# Patient Record
Sex: Male | Born: 1990 | Race: White | Hispanic: No | Smoking: Former smoker
Health system: Southern US, Community
[De-identification: ages and names within clinical notes are randomized; demographics above are authoritative.]

## PROBLEM LIST (undated history)

## (undated) DIAGNOSIS — F1010 Alcohol abuse, uncomplicated: Secondary | ICD-10-CM

## (undated) DIAGNOSIS — A048 Other specified bacterial intestinal infections: Secondary | ICD-10-CM

## (undated) DIAGNOSIS — I10 Essential (primary) hypertension: Secondary | ICD-10-CM

## (undated) DIAGNOSIS — M8700 Idiopathic aseptic necrosis of unspecified bone: Secondary | ICD-10-CM

## (undated) DIAGNOSIS — J45909 Unspecified asthma, uncomplicated: Secondary | ICD-10-CM

## (undated) HISTORY — DX: Unspecified asthma, uncomplicated: J45.909

## (undated) HISTORY — DX: Idiopathic aseptic necrosis of unspecified bone: M87.00

## (undated) HISTORY — PX: TOTAL HIP ARTHROPLASTY: SHX124

## (undated) HISTORY — DX: Other specified bacterial intestinal infections: A04.8

## (undated) HISTORY — PX: NO PAST SURGERIES: SHX2092

## (undated) HISTORY — DX: Alcohol abuse, uncomplicated: F10.10

---

## 2014-12-31 ENCOUNTER — Telehealth (HOSPITAL_COMMUNITY): Payer: Self-pay | Admitting: Family Medicine

## 2014-12-31 ENCOUNTER — Emergency Department (INDEPENDENT_AMBULATORY_CARE_PROVIDER_SITE_OTHER)
Admission: EM | Admit: 2014-12-31 | Discharge: 2014-12-31 | Disposition: A | Discharge status: Home / Self Care | Payer: PRIVATE HEALTH INSURANCE | Source: Home / Self Care | Attending: Family Medicine | Admitting: Family Medicine

## 2014-12-31 ENCOUNTER — Encounter (HOSPITAL_COMMUNITY): Payer: Self-pay | Admitting: Emergency Medicine

## 2014-12-31 DIAGNOSIS — R109 Unspecified abdominal pain: Secondary | ICD-10-CM | POA: Diagnosis not present

## 2014-12-31 DIAGNOSIS — R42 Dizziness and giddiness: Secondary | ICD-10-CM | POA: Diagnosis not present

## 2014-12-31 LAB — CK: Total CK: 95 U/L (ref 49–397)

## 2014-12-31 LAB — CBC
HCT: 43.6 % (ref 39.0–52.0)
HEMOGLOBIN: 15.6 g/dL (ref 13.0–17.0)
MCH: 30.8 pg (ref 26.0–34.0)
MCHC: 35.8 g/dL (ref 30.0–36.0)
MCV: 86 fL (ref 78.0–100.0)
Platelets: 176 10*3/uL (ref 150–400)
RBC: 5.07 MIL/uL (ref 4.22–5.81)
RDW: 12 % (ref 11.5–15.5)
WBC: 7 10*3/uL (ref 4.0–10.5)

## 2014-12-31 LAB — COMPREHENSIVE METABOLIC PANEL
ALBUMIN: 4.4 g/dL (ref 3.5–5.0)
ALT: 14 U/L — ABNORMAL LOW (ref 17–63)
ANION GAP: 8 (ref 5–15)
AST: 19 U/L (ref 15–41)
Alkaline Phosphatase: 55 U/L (ref 38–126)
BUN: 13 mg/dL (ref 6–20)
CALCIUM: 9.5 mg/dL (ref 8.9–10.3)
CHLORIDE: 104 mmol/L (ref 101–111)
CO2: 27 mmol/L (ref 22–32)
CREATININE: 1.3 mg/dL — AB (ref 0.61–1.24)
GFR calc non Af Amer: >60 mL/min (ref >60)
GLUCOSE: 96 mg/dL (ref 65–99)
Potassium: 3.9 mmol/L (ref 3.5–5.1)
SODIUM: 139 mmol/L (ref 135–145)
TOTAL PROTEIN: 7.3 g/dL (ref 6.5–8.1)
Total Bilirubin: 0.7 mg/dL (ref 0.3–1.2)

## 2014-12-31 LAB — LIPASE, BLOOD: LIPASE: 38 U/L (ref 22–51)

## 2014-12-31 NOTE — ED Notes (Signed)
C/o intermittent abd pain x1 month Sx will include cramping, bloating, nauseas, and feeling light headed Denies: fevers, chills, diarrhea  Alert, no signs of acute distress.

## 2014-12-31 NOTE — ED Notes (Signed)
Called patient discussed results. He will follow-up with the PCP and return to clinic in 2 days for lab recheck. He will drink plenty of fluids.  Rodolph Bong, MD 12/31/14 [phone]

## 2014-12-31 NOTE — ED Notes (Signed)
Call back number verified.  

## 2014-12-31 NOTE — Discharge Instructions (Signed)
Thank you for coming in today. If your belly pain worsens, or you have high fever, bad vomiting, blood in your stool or black tarry stool go to the Emergency Room.  Call or go to the emergency room if you get worse, have trouble breathing, have chest pains, or palpitations.  Follow up with a doctor.  Drink Plenty of fluids.  I will call you if any labs come back positive.   Orthostatic Hypotension Orthostatic hypotension is a sudden drop in blood pressure. It happens when you quickly stand up from a seated or lying position. You may feel dizzy or light-headed. This can last for just a few seconds or for up to a few minutes. It is usually not a serious problem. However, if this happens frequently or gets worse, it can be a sign of something more serious. CAUSES  Different things can cause orthostatic hypotension, including:   Loss of body fluids (dehydration).  Medicines that lower blood pressure.  Sudden changes in posture, such as standing up quickly after you have been sitting or lying down.  Taking too much of your medicine. SIGNS AND SYMPTOMS   Light-headedness or dizziness.   Fainting or near-fainting.   A fast heart rate.   Weakness.   Feeling tired (fatigue).  DIAGNOSIS  Your health care provider may do several things to help diagnose your condition and identify the cause. These may include:   Taking a medical history and doing a physical exam.  Checking your blood pressure. Your health care provider will check your blood pressure when you are:  Lying down.  Sitting.  Standing.  Using tilt table testing. In this test, you lie down on a table that moves from a lying position to a standing position. You will be strapped onto the table. This test monitors your blood pressure and heart rate when you are in different positions. TREATMENT  Treatment will vary depending on the cause. Possible treatments include:   Changing the dosage of your medicines.  Wearing  compression stockings on your lower legs.  Standing up slowly after sitting or lying down.  Eating more salt.  Eating frequent, small meals.  In some cases, getting IV fluids.  Taking medicine to enhance fluid retention. HOME CARE INSTRUCTIONS  Only take over-the-counter or prescription medicines as directed by your health care provider.  Follow your health care provider's instructions for changing the dosage of your current medicines.  Do not stop or adjust your medicine on your own.  Stand up slowly after sitting or lying down. This allows your body to adjust to the different position.  Wear compression stockings as directed.  Eat extra salt as directed.  Do not add extra salt to your diet unless directed to by your health care provider.  Eat frequent, small meals.  Avoid standing suddenly after eating.  Avoid hot showers or excessive heat as directed by your health care provider.  Keep all follow-up appointments. SEEK MEDICAL CARE IF:  You continue to feel dizzy or light-headed after standing.  You feel groggy or confused.  You feel cold, clammy, or sick to your stomach (nauseous).  You have blurred vision.  You feel short of breath. SEEK IMMEDIATE MEDICAL CARE IF:   You faint after standing.  You have chest pain.  You have difficulty breathing.   You lose feeling or movement in your arms or legs.   You have slurred speech or difficulty talking, or you are unable to talk.  MAKE SURE YOU:  Understand these instructions.  Will watch your condition.  Will get help right away if you are not doing well or get worse. Document Released: 06/26/2002 Document Revised: 07/11/2013 Document Reviewed: 04/28/2013 [facility] Patient Information 2015 [place], [place]. This information is not intended to replace advice given to you by your health care provider. Make sure you discuss any questions you have with your health care provider.    Abdominal Pain Many  things can cause belly (abdominal) pain. Most times, the belly pain is not dangerous. Many cases of belly pain can be watched and treated at home. HOME CARE   Do not take medicines that help you go poop (laxatives) unless told to by your doctor.  Only take medicine as told by your doctor.  Eat or drink as told by your doctor. Your doctor will tell you if you should be on a special diet. GET HELP IF:  You do not know what is causing your belly pain.  You have belly pain while you are sick to your stomach (nauseous) or have runny poop (diarrhea).  You have pain while you pee or poop.  Your belly pain wakes you up at night.  You have belly pain that gets worse or better when you eat.  You have belly pain that gets worse when you eat fatty foods.  You have a fever. GET HELP RIGHT AWAY IF:   The pain does not go away within 2 hours.  You keep throwing up (vomiting).  The pain changes and is only in the right or left part of the belly.  You have bloody or tarry looking poop. MAKE SURE YOU:   Understand these instructions.  Will watch your condition.  Will get help right away if you are not doing well or get worse. Document Released: 12/23/2007 Document Revised: 07/11/2013 Document Reviewed: 03/15/2013 [facility] Patient Information 2015 [place], [place]. This information is not intended to replace advice given to you by your health care provider. Make sure you discuss any questions you have with your health care provider.

## 2014-12-31 NOTE — ED Provider Notes (Signed)
Billy Johnson is a 24 y.o. male who presents to Urgent Care today for abdominal cramping. Patient has a one-month history of intermittent abdominal cramping associated with nausea feeling is that his heart is racing and lightheadedness. He denies any chest pains. Symptoms have been present for about a month. His symptoms are nonexertional. He has no significant diarrhea or constipation. No significant abdominal pain. He feels well otherwise.   History reviewed. No pertinent past medical history. History reviewed. No pertinent past surgical history. History  Substance Use Topics  . Smoking status: Current Every Day Smoker  . Smokeless tobacco: Not on file  . Alcohol Use: Yes   ROS as above Medications: No current facility-administered medications for this encounter.   No current outpatient prescriptions on file.   No Known Allergies   Exam:  BP 141/85 mmHg  Pulse 62  Temp(Src) 98.4 F (36.9 C) (Oral)  Resp 16  SpO2 99%  Orthostatic VS for the past 24 hrs:  BP- Lying Pulse- Lying BP- Sitting Pulse- Sitting BP- Standing at 0 minutes Pulse- Standing at 0 minutes  12/31/14 1723 123/78 mmHg 65 (!) 130/92 mmHg 70 122/85 mmHg 87      Gen: Well NAD HEENT: EOMI,  MMM Lungs: Normal work of breathing. CTABL Heart: RRR no MRG Abd: NABS, Soft. Nondistended, Nontender Exts: Brisk capillary refill, warm and well perfused.    ED ECG REPORT   Date: 12/31/2014  Rate: 71  Rhythm: normal sinus rhythm and sinus arrhythmia  QRS Axis: normal  Intervals: normal  ST/T Wave abnormalities: normal  Conduction Disutrbances:none  Narrative Interpretation:   Old EKG Reviewed: none available  I have personally reviewed the EKG tracing and agree with the computerized printout as noted.   Results for orders placed or performed during the hospital encounter of 12/31/14 (from the past 24 hour(s))  Comprehensive metabolic panel     Status: Abnormal   Collection Time: 12/31/14  5:16 PM   Result Value Ref Range   Sodium 139 135 - 145 mmol/L   Potassium 3.9 3.5 - 5.1 mmol/L   Chloride 104 101 - 111 mmol/L   CO2 27 22 - 32 mmol/L   Glucose, Bld 96 65 - 99 mg/dL   BUN 13 6 - 20 mg/dL   Creatinine, Ser 9.79 (H) 0.61 - 1.24 mg/dL   Calcium 9.5 8.9 - 89.2 mg/dL   Total Protein 7.3 6.5 - 8.1 g/dL   Albumin 4.4 3.5 - 5.0 g/dL   AST 19 15 - 41 U/L   ALT 14 (L) 17 - 63 U/L   Alkaline Phosphatase 55 38 - 126 U/L   Total Bilirubin 0.7 0.3 - 1.2 mg/dL   GFR calc non Af Amer >60 >60 mL/min   GFR calc Af Amer >60 >60 mL/min   Anion gap 8 5 - 15  Lipase, blood     Status: None   Collection Time: 12/31/14  5:16 PM  Result Value Ref Range   Lipase 38 22 - 51 U/L  CBC     Status: None   Collection Time: 12/31/14  5:16 PM  Result Value Ref Range   WBC 7.0 4.0 - 10.5 K/uL   RBC 5.07 4.22 - 5.81 MIL/uL   Hemoglobin 15.6 13.0 - 17.0 g/dL   HCT 11.9 41.7 - 40.8 %   MCV 86.0 78.0 - 100.0 fL   MCH 30.8 26.0 - 34.0 pg   MCHC 35.8 30.0 - 36.0 g/dL   RDW 14.4 81.8 - 56.3 %  Platelets 176 150 - 400 K/uL  CK     Status: None   Collection Time: 12/31/14  5:16 PM  Result Value Ref Range   Total CK 95 49 - 397 U/L   No results found.  Assessment and Plan: 24 y.o. male with mild orthostasis and abdominal cramping. Patient was sent home before labs were returned. CK was added on. I have called patient and discuss results. His main abnormal lab finding is an elevated creatinine at 1.3. His baseline is unknown. Plan to follow-up in 2 days for lab recheck and follow-up with PCP.  Discussed warning signs or symptoms. Please see discharge instructions. Patient expresses understanding.     Rodolph Bong, MD 12/31/14 [phone]

## 2015-04-22 ENCOUNTER — Emergency Department (HOSPITAL_BASED_OUTPATIENT_CLINIC_OR_DEPARTMENT_OTHER): Payer: No Typology Code available for payment source

## 2015-04-22 ENCOUNTER — Encounter (HOSPITAL_BASED_OUTPATIENT_CLINIC_OR_DEPARTMENT_OTHER): Payer: Self-pay | Admitting: *Deleted

## 2015-04-22 ENCOUNTER — Emergency Department (HOSPITAL_BASED_OUTPATIENT_CLINIC_OR_DEPARTMENT_OTHER)
Admission: EM | Admit: 2015-04-22 | Discharge: 2015-04-22 | Disposition: A | Discharge status: Home / Self Care | Payer: No Typology Code available for payment source | Attending: Emergency Medicine | Admitting: Emergency Medicine

## 2015-04-22 DIAGNOSIS — W228XXA Striking against or struck by other objects, initial encounter: Secondary | ICD-10-CM | POA: Insufficient documentation

## 2015-04-22 DIAGNOSIS — Y9289 Other specified places as the place of occurrence of the external cause: Secondary | ICD-10-CM | POA: Diagnosis not present

## 2015-04-22 DIAGNOSIS — Y9389 Activity, other specified: Secondary | ICD-10-CM | POA: Insufficient documentation

## 2015-04-22 DIAGNOSIS — M79672 Pain in left foot: Secondary | ICD-10-CM

## 2015-04-22 DIAGNOSIS — Z87891 Personal history of nicotine dependence: Secondary | ICD-10-CM | POA: Diagnosis not present

## 2015-04-22 DIAGNOSIS — S93602A Unspecified sprain of left foot, initial encounter: Secondary | ICD-10-CM | POA: Diagnosis not present

## 2015-04-22 DIAGNOSIS — S93402A Sprain of unspecified ligament of left ankle, initial encounter: Secondary | ICD-10-CM

## 2015-04-22 DIAGNOSIS — Y998 Other external cause status: Secondary | ICD-10-CM | POA: Diagnosis not present

## 2015-04-22 DIAGNOSIS — S99912A Unspecified injury of left ankle, initial encounter: Secondary | ICD-10-CM | POA: Diagnosis present

## 2015-04-22 MED ORDER — IBUPROFEN 800 MG PO TABS
800.0000 mg | ORAL_TABLET | Freq: Once | ORAL | Status: AC
  Administered 2015-04-22: 800 mg via ORAL
  Filled 2015-04-22: qty 1

## 2015-04-22 MED ORDER — HYDROCODONE-ACETAMINOPHEN 5-325 MG PO TABS: 1.0000 | ORAL_TABLET | Freq: Four times a day (QID) | ORAL | Status: DC | PRN

## 2015-04-22 MED ORDER — HYDROCODONE-ACETAMINOPHEN 5-325 MG PO TABS
2.0000 | ORAL_TABLET | Freq: Once | ORAL | Status: AC
  Administered 2015-04-22: 2 via ORAL
  Filled 2015-04-22: qty 2

## 2015-04-22 NOTE — ED Notes (Signed)
Dr. Molpus at [facility] 

## 2015-04-22 NOTE — ED Notes (Signed)
Pt to xray at this time. Pt alert, NAD, calm, interactive, resps e/u, no dyspnea noted, CMS intact, ROM decreased d/t pain, skin intact, swelling noted to ankle, no obvious bruising or deformity.

## 2015-04-22 NOTE — ED Provider Notes (Addendum)
CSN: [id number]     Arrival date & time May 05, 2015  0149 History   First MD Initiated Contact with Patient 2015/05/05 0319     Chief Complaint  Patient presents with  . Ankle Injury     (Consider location/radiation/quality/duration/timing/severity/associated sxs/prior Treatment) HPI  This is a 24 year old male who injured his left ankle while doing parcour yesterday afternoon about 4 PM. He slammed his left foot against the wall and either hyperdorsiflexed it or inverted it. He is having moderate to severe pain primarily over the base of the left fifth metatarsal. Pain is worse with ambulation, palpation or movement. There is associated swelling. He denies other injury.  History reviewed. No pertinent past medical history. History reviewed. No pertinent past surgical history. No family history on file. Social History  Substance Use Topics  . Smoking status: Former Games developer  . Smokeless tobacco: None  . Alcohol Use: Yes    Review of Systems  All other systems reviewed and are negative.   Allergies  Peanuts  Home Medications   Prior to Admission medications   Not on File   BP 136/92 mmHg  Pulse 107  Temp(Src) 98.7 F (37.1 C)  Resp 20  Ht  (1.727 m)  Wt 142 lb (64.411 kg)  BMI 21.60 kg/m2  SpO2 98%   Physical Exam  General: Well-developed, well-nourished male in no acute distress; appearance consistent with age of record HENT: normocephalic; atraumatic Eyes: pupils equal, round and reactive to light; extraocular muscles intact Neck: supple; nontender Heart: regular rate and rhythm Lungs: clear to auscultation bilaterally Abdomen: soft; nondistended; nontender Extremities: No deformity; full range of motion except left foot and ankle due to pain; swelling, tenderness and ecchymosis overlying the base of the left fifth metatarsal, left foot distally neurovascularly intact with intact tendon function Neurologic: Awake, alert and oriented; motor function intact in all  extremities and symmetric; no facial droop Skin: Warm and dry Psychiatric: Normal mood and affect    ED Course  Procedures (including critical care time)   MDM  Nursing notes and vitals signs, including pulse oximetry, reviewed.  Summary of this visit's results, reviewed by myself:  Imaging Studies: Dg Ankle Complete Left  05-05-2015   CLINICAL DATA:  Injury to left ankle while doing parkour. Initial encounter.  EXAM: LEFT ANKLE COMPLETE - 3+ VIEW  COMPARISON:  None.  FINDINGS: There is no evidence of fracture or dislocation. The ankle mortise is intact; the interosseous space is within normal limits. No talar tilt or subluxation is seen.  The joint spaces are preserved. No significant soft tissue abnormalities are seen.  IMPRESSION: No evidence of fracture or dislocation.   Electronically Signed   By: Roanna Raider M.D.   On: 2015-05-05 02:55   Dg Foot Complete Left  05/05/2015   CLINICAL DATA:  Injury to left ankle and fell while doing parkour. Left lateral foot and ankle pain. Initial encounter.  EXAM: LEFT FOOT - COMPLETE 3+ VIEW  COMPARISON:  None.  FINDINGS: There is no evidence of fracture or dislocation. The joint spaces are preserved. There is no evidence of talar subluxation; the subtalar joint is unremarkable in appearance.  No significant soft tissue abnormalities are seen.  IMPRESSION: No evidence of fracture or dislocation.   Electronically Signed   By: Roanna Raider M.D.   On: 05-May-2015 02:56      Paula Libra, MD May 05, 2015 [id number]  Paula Libra, MD May 05, 2015 [phone]

## 2015-04-22 NOTE — ED Notes (Signed)
Pt states that he injured his left ankle while doing parcour today around 4pm.  States he was able to initially bear weight but states now unable to walk due to pain. No other injury noted. Swelling noted to left outer aspect of foot.

## 2016-06-19 IMAGING — DX DG ANKLE COMPLETE 3+V*L*
3 series · 3 of 3 positions shown · non-contrast
Comparison: None.

CLINICAL DATA: Injury to left ankle while doing parkour. Initial
encounter.

EXAM:
LEFT ANKLE COMPLETE - 3+ VIEW

[ankle ap]
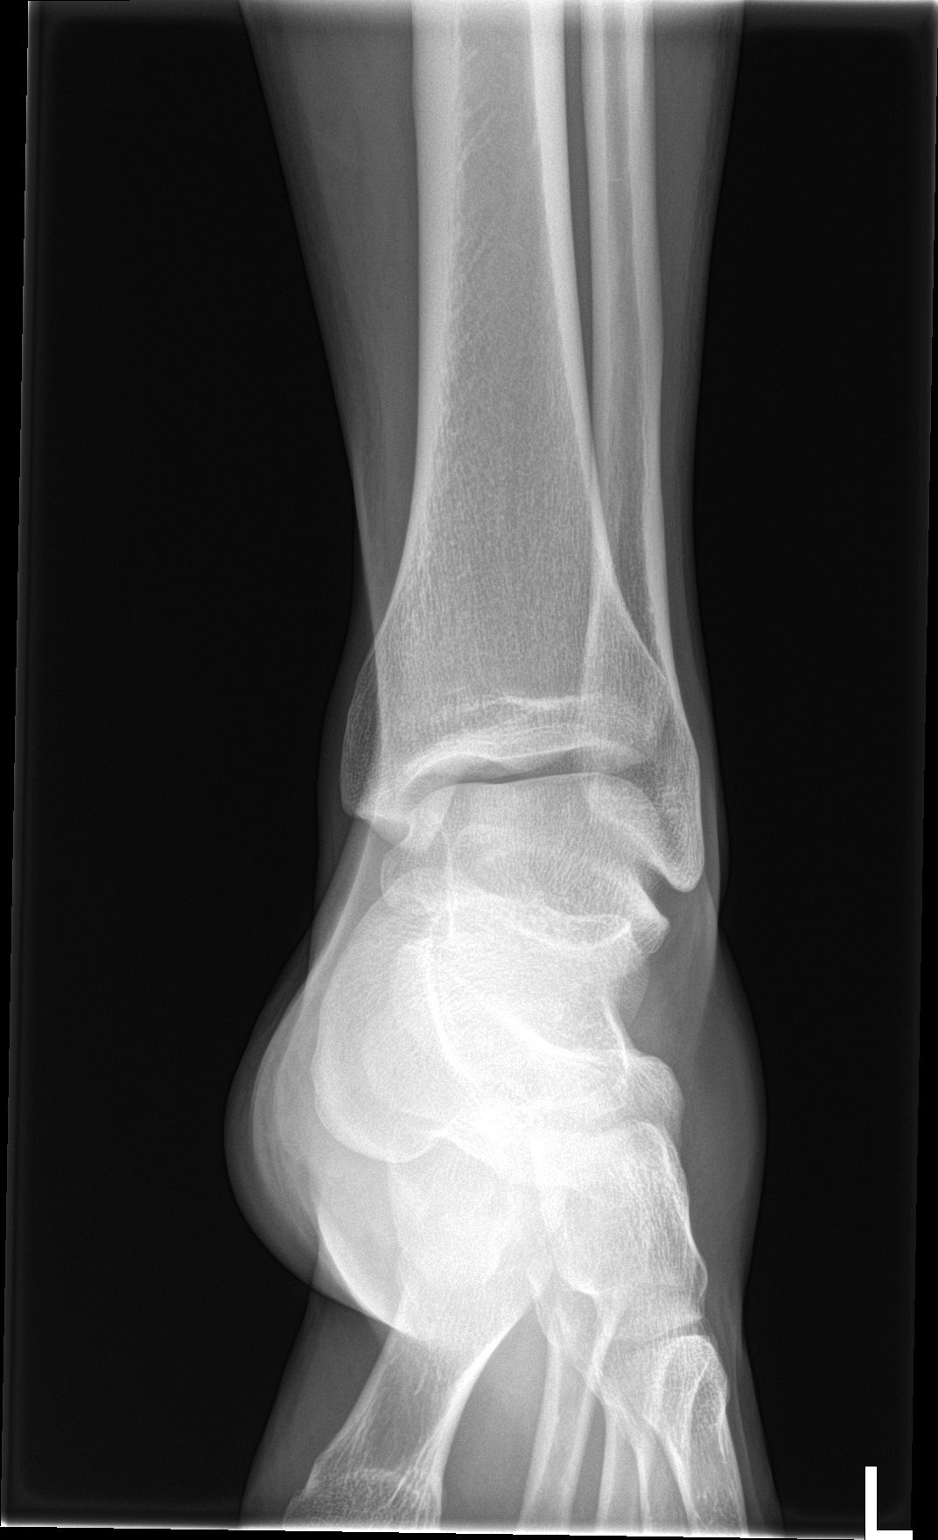

[ankle obl]
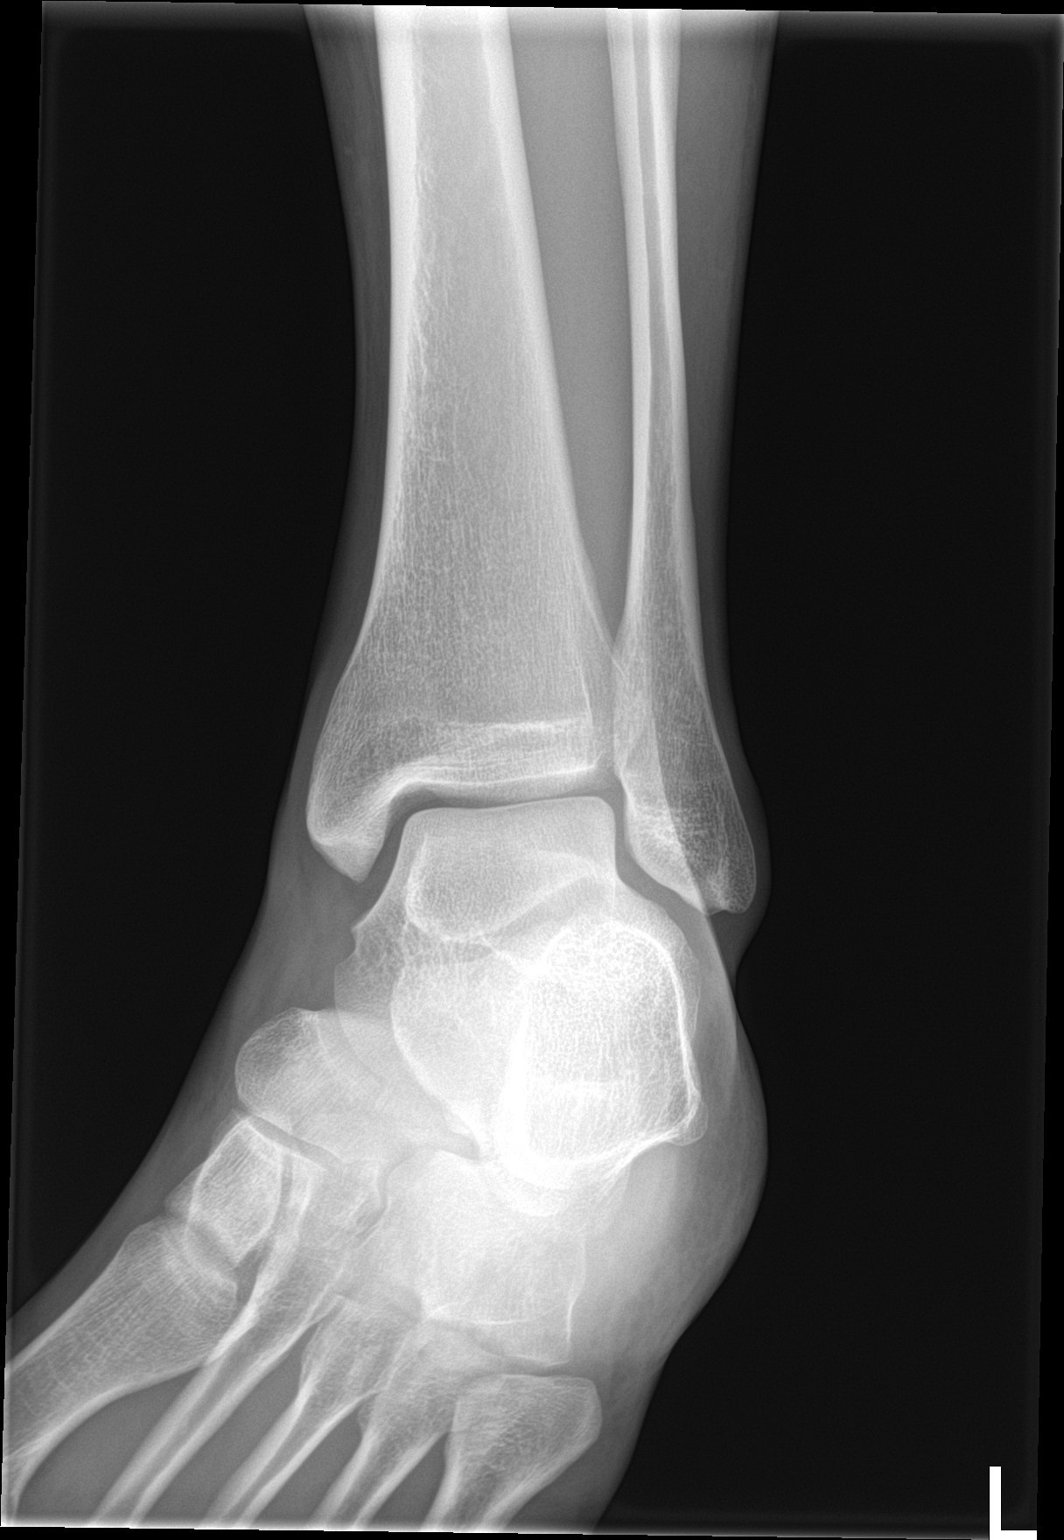

[ankle lat]
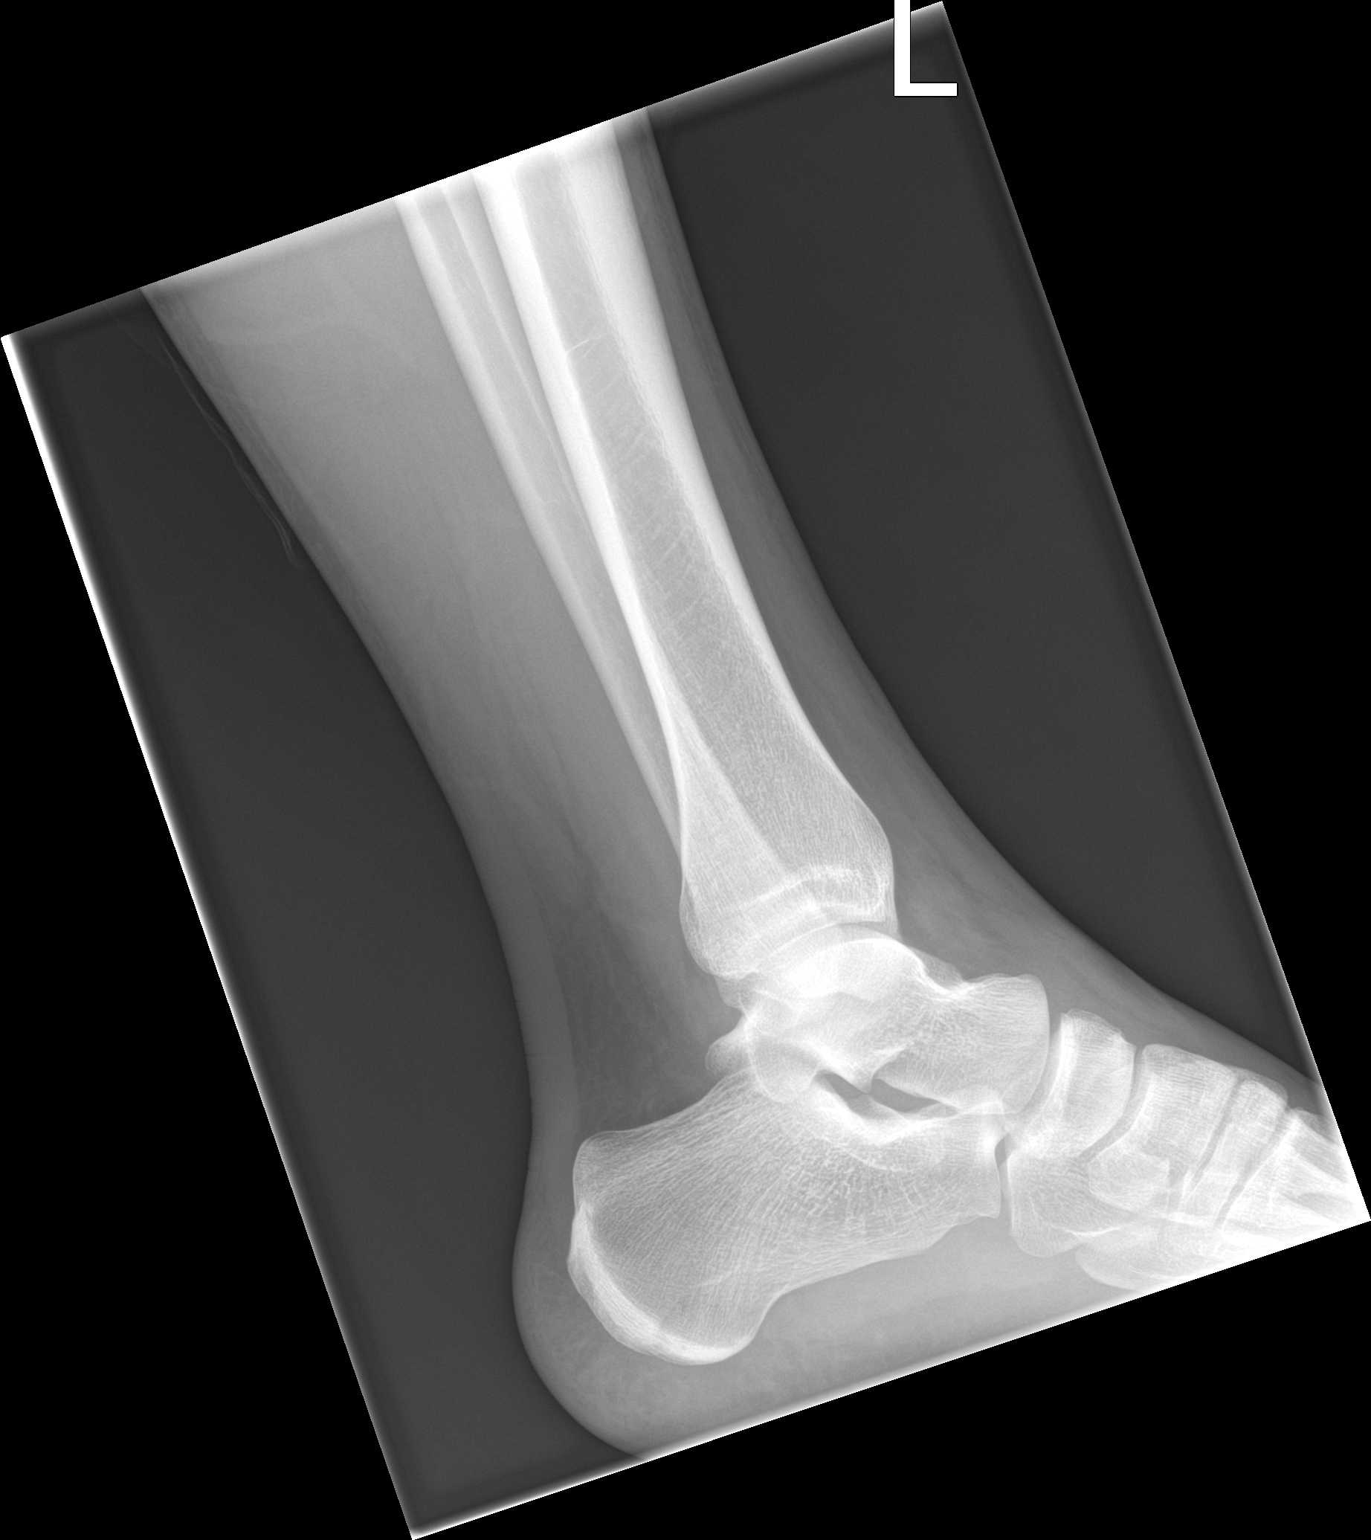

[3 of 3 positions shown; findings below may reference images not displayed]

FINDINGS: There is no evidence of fracture or dislocation. The ankle mortise
is intact; the interosseous space is within normal limits. No talar
tilt or subluxation is seen.

The joint spaces are preserved. No significant soft tissue
abnormalities are seen.
IMPRESSION: No evidence of fracture or dislocation.

## 2016-06-19 IMAGING — DX DG FOOT COMPLETE 3+V*L*
3 series · 3 of 3 positions shown · non-contrast
Comparison: None.

CLINICAL DATA: Injury to left ankle and fell while doing parkour.
Left lateral foot and ankle pain. Initial encounter.

EXAM:
LEFT FOOT - COMPLETE 3+ VIEW

[foot ap]
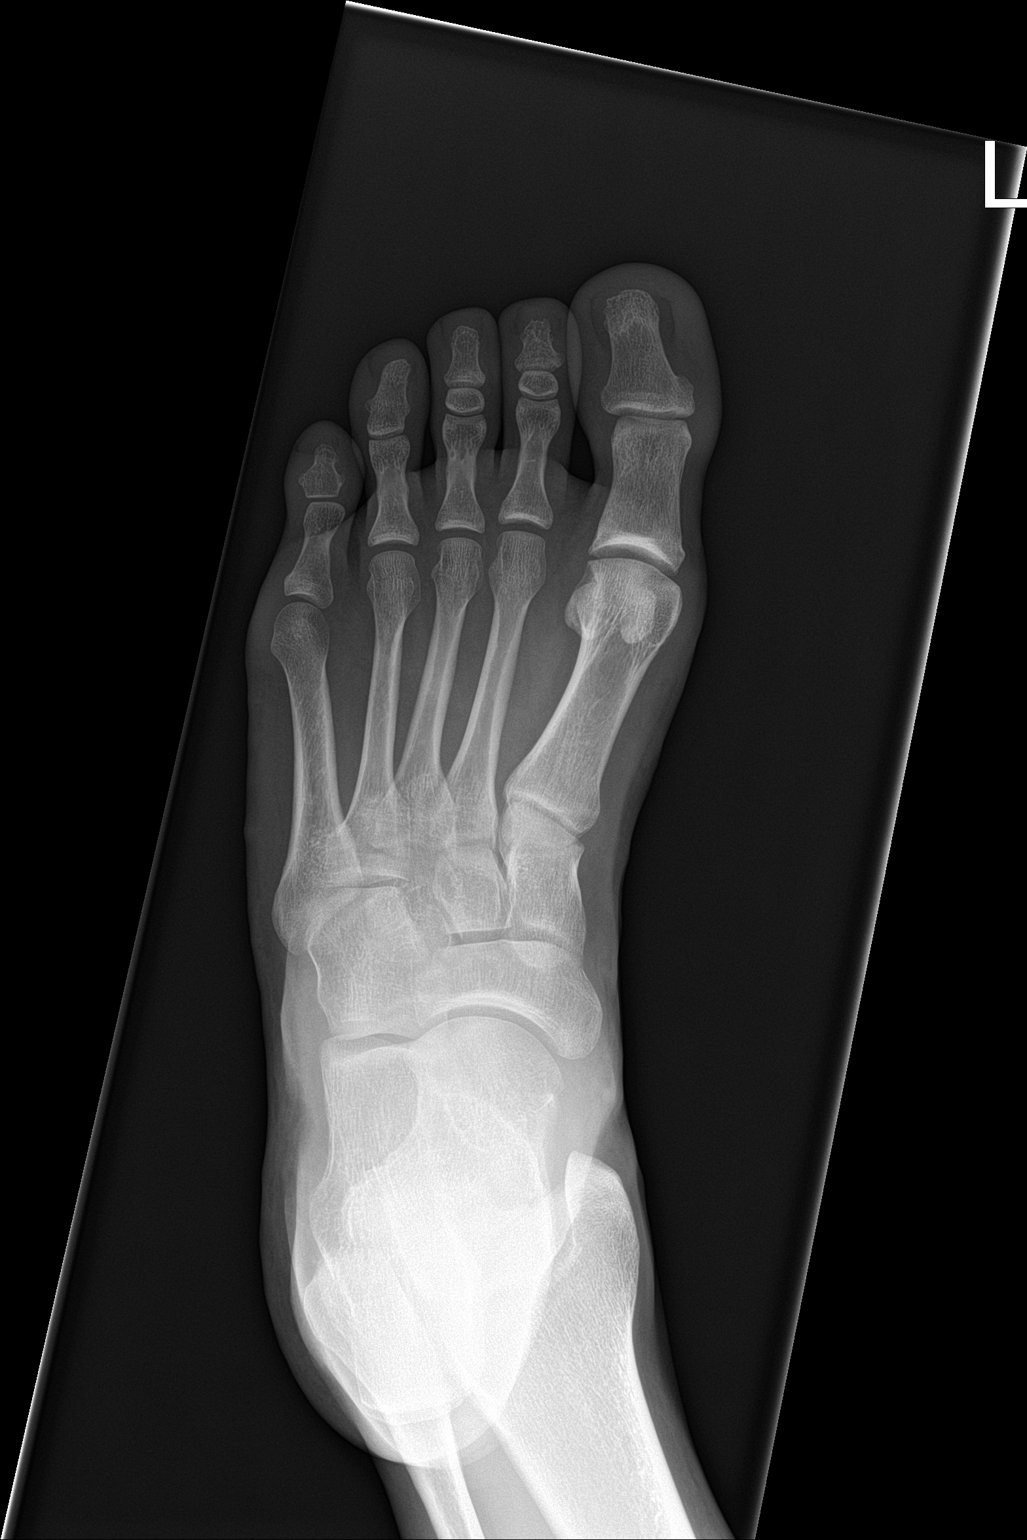

[foot obl]
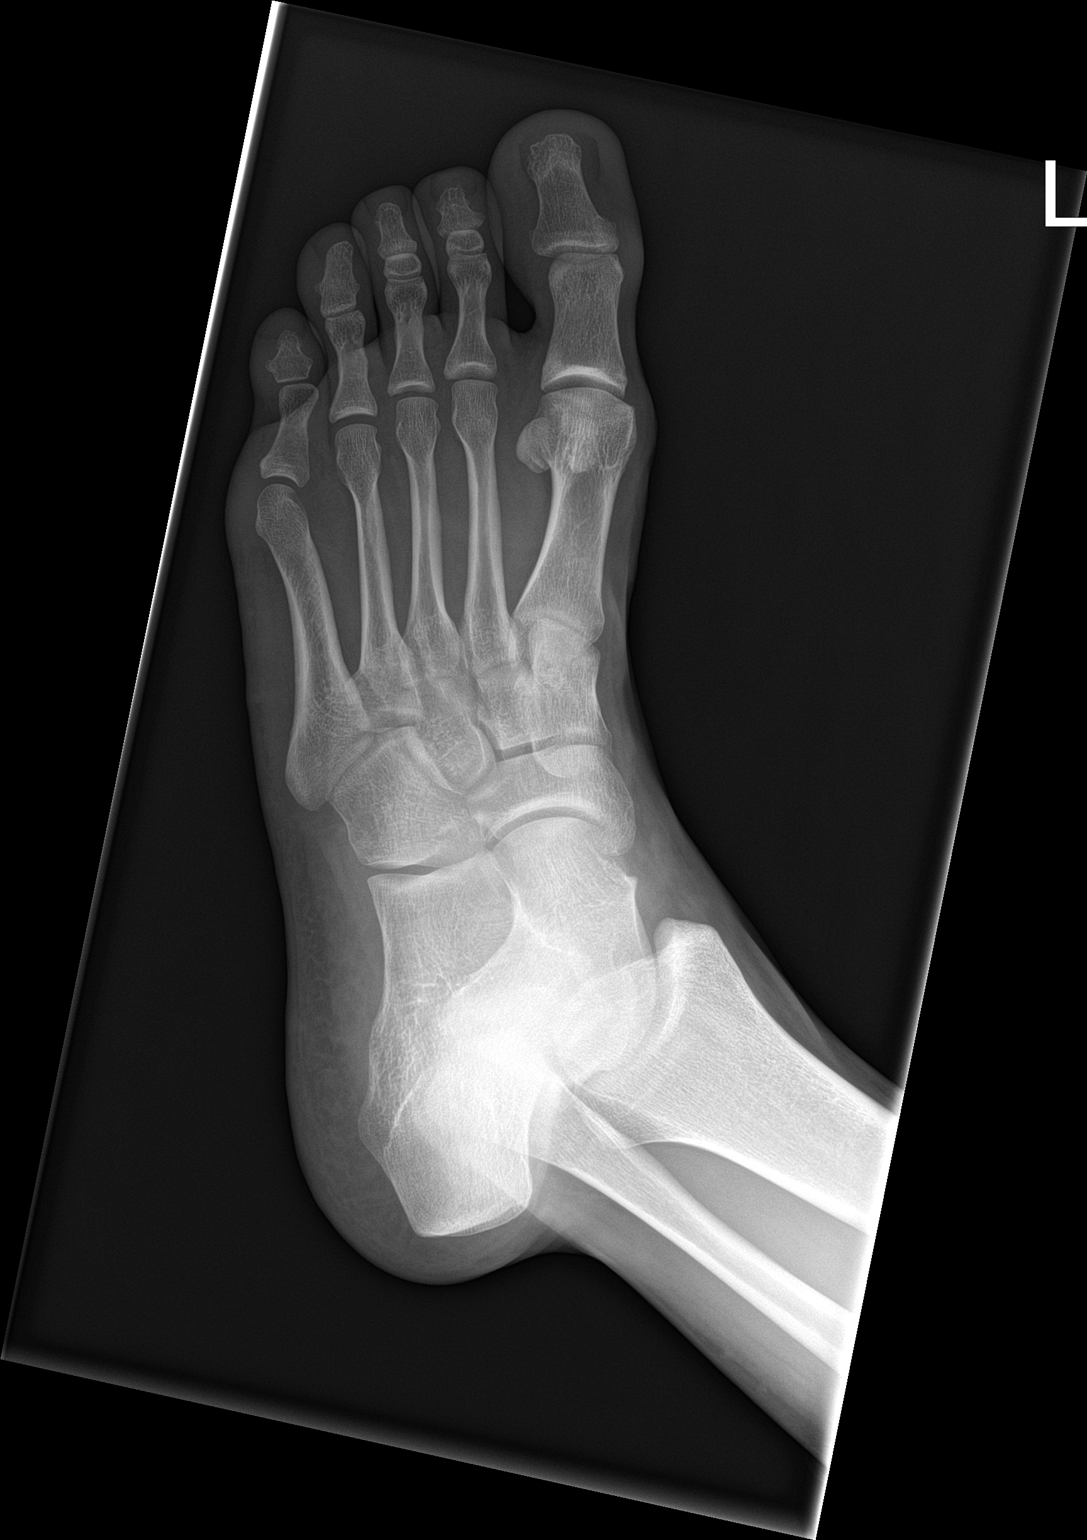

[foot lat]
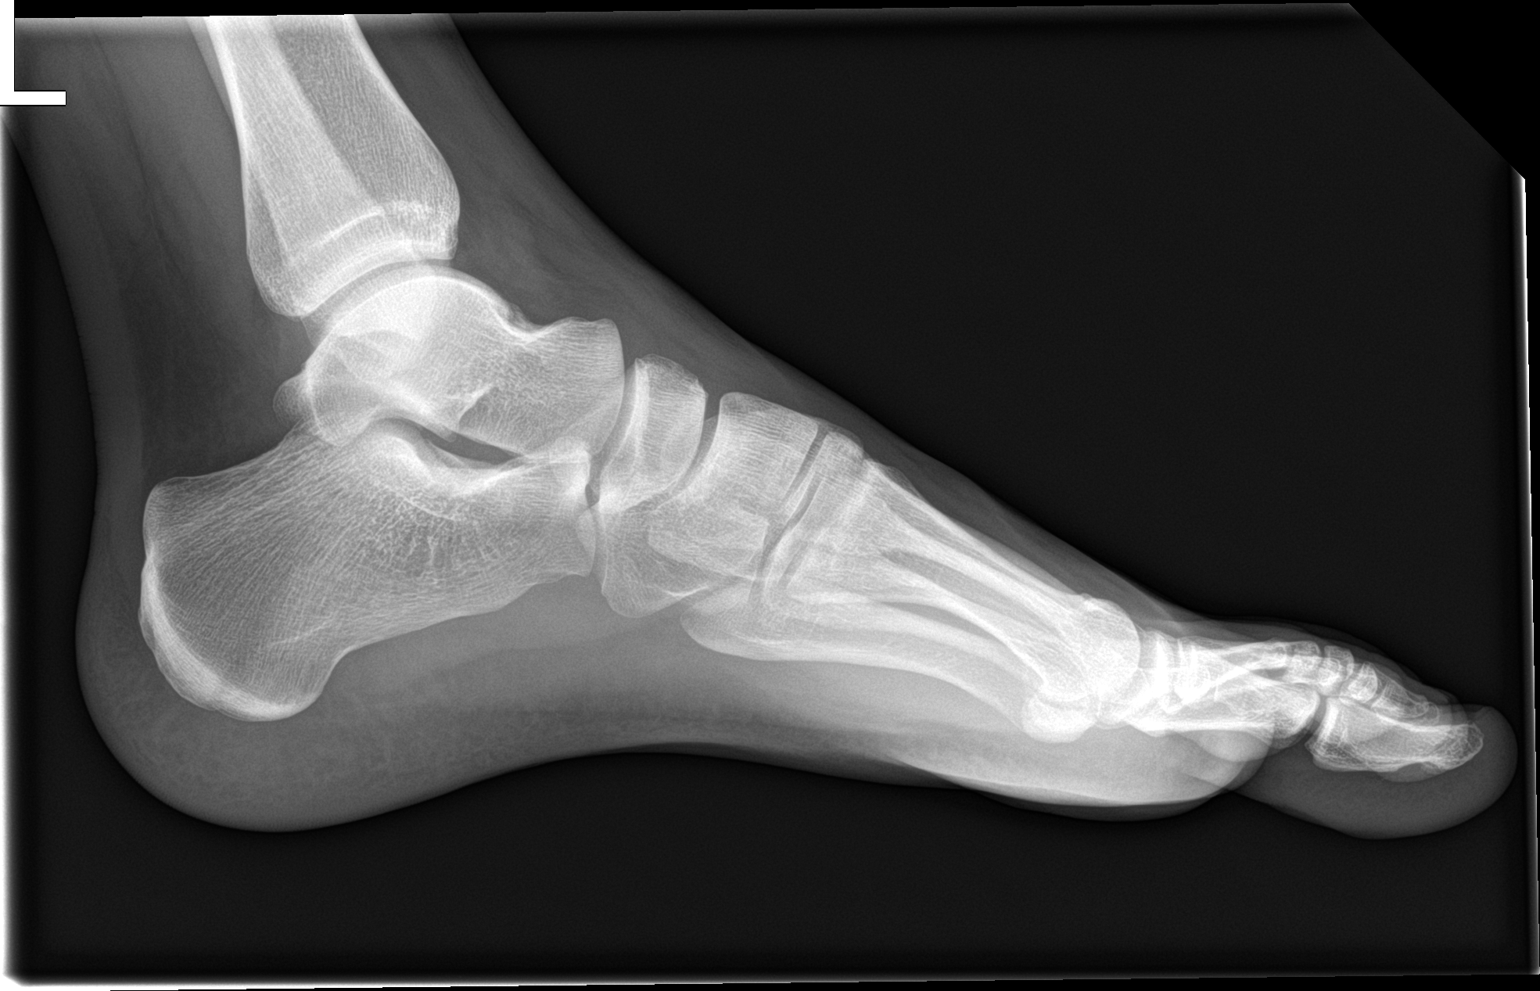

[3 of 3 positions shown; findings below may reference images not displayed]

FINDINGS: There is no evidence of fracture or dislocation. The joint spaces
are preserved. There is no evidence of talar subluxation; the
subtalar joint is unremarkable in appearance.

No significant soft tissue abnormalities are seen.
IMPRESSION: No evidence of fracture or dislocation.

## 2020-04-30 ENCOUNTER — Ambulatory Visit: Payer: PRIVATE HEALTH INSURANCE

## 2020-04-30 ENCOUNTER — Ambulatory Visit: Payer: PRIVATE HEALTH INSURANCE | Attending: Nurse Practitioner | Admitting: Nurse Practitioner

## 2020-04-30 ENCOUNTER — Encounter: Payer: Self-pay | Admitting: Nurse Practitioner

## 2020-04-30 ENCOUNTER — Other Ambulatory Visit: Payer: Self-pay

## 2020-04-30 VITALS — Ht 69.0 in | Wt 210.0 lb

## 2020-04-30 DIAGNOSIS — Z13220 Encounter for screening for lipoid disorders: Secondary | ICD-10-CM

## 2020-04-30 DIAGNOSIS — Z130 Encounter for screening for diseases of the blood and blood-forming organs and certain disorders involving the immune mechanism: Secondary | ICD-10-CM

## 2020-04-30 DIAGNOSIS — Z7689 Persons encountering health services in other specified circumstances: Secondary | ICD-10-CM

## 2020-04-30 DIAGNOSIS — Z13228 Encounter for screening for other metabolic disorders: Secondary | ICD-10-CM

## 2020-04-30 DIAGNOSIS — I10 Essential (primary) hypertension: Secondary | ICD-10-CM

## 2020-04-30 DIAGNOSIS — F1010 Alcohol abuse, uncomplicated: Secondary | ICD-10-CM

## 2020-04-30 DIAGNOSIS — Z131 Encounter for screening for diabetes mellitus: Secondary | ICD-10-CM

## 2020-04-30 MED ORDER — AMLODIPINE BESYLATE 5 MG PO TABS: 5.0000 mg | ORAL_TABLET | Freq: Every day | ORAL | 3 refills | Status: DC

## 2020-04-30 NOTE — Progress Notes (Signed)
Virtual Visit via Telephone Note Due to national recommendations of social distancing due to [place] 19, telehealth visit is felt to be most appropriate for this patient at this time.  I discussed the limitations, risks, security and privacy concerns of performing an evaluation and management service by telephone and the availability of in person appointments. I also discussed with the patient that there may be a patient responsible charge related to this service. The patient expressed understanding and agreed to proceed.    I connected with Billy Johnson on 04/30/20  at   8:50 AM EDT  EDT by telephone and verified that I am speaking with the correct person using two identifiers.   Consent I discussed the limitations, risks, security and privacy concerns of performing an evaluation and management service by telephone and the availability of in person appointments. I also discussed with the patient that there may be a patient responsible charge related to this service. The patient expressed understanding and agreed to proceed.   Location of Patient: Private Residence    Location of Provider: [place] participating in Telemedicine visit: Billy Rankins FNP-BC [place]    History of Present Illness: Telemedicine visit for: Billy Johnson he is Drinking "too much" alcohol since the pandemic. Feeling bad at times. Concerned about his health. When he tries to wean himself off alcohol he doesn't feel like himself. He is monitoring his blood pressure at home with Average BP readings: 140/80-90. 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 Starting drinking excessively right after the pandemic 1.5 years ago. He has taken amlodipine 10 mg and losartan 50 mg as needed for his blood pressure (his friend's medication).   BP Readings from Last 3 Encounters:  04/22/15 136/72  12/31/14 141/85      Past Medical History:   Diagnosis Date  . Asthma     Past Surgical History:  Procedure Laterality Date  . NO PAST SURGERIES      Family History  Problem Relation Age of Onset  . Diabetes Neg Hx     Social History   Socioeconomic History  . Marital status: Single    Spouse name: Not on file  . Number of children: Not on file  . Years of education: Not on file  . Highest education level: Not on file  Occupational History  . Not on file  Tobacco Use  . Smoking status: Former Research scientist (life sciences)  . Smokeless tobacco: Current User  Vaping Use  . Vaping Use: Every day  Substance and Sexual Activity  . Alcohol use: Yes  . Drug use: No  . Sexual activity: Yes  Other Topics Concern  . Not on file  Social History Narrative  . Not on file   Social Determinants of Health   Financial Resource Strain:   . Difficulty of Paying Living Expenses: Not on file  Food Insecurity:   . Worried About Charity fundraiser in the Last Year: Not on file  . Ran Out of Food in the Last Year: Not on file  Transportation Needs:   . Lack of Transportation (Medical): Not on file  . Lack of Transportation (Non-Medical): Not on file  Physical Activity:   . Days of Exercise per Week: Not on file  . Minutes of Exercise per Session: Not on file  Stress:   . Feeling of Stress : Not on file  Social Connections:   . Frequency of Communication  with Friends and Family: Not on file  . Frequency of Social Gatherings with Friends and Family: Not on file  . Attends Religious Services: Not on file  . Active Member of Clubs or Organizations: Not on file  . Attends Archivist Meetings: Not on file  . Marital Status: Not on file     Observations/Objective: Awake, alert and oriented x 3   Review of Systems  Constitutional: Negative for fever, malaise/fatigue and weight loss.  HENT: Negative.  Negative for nosebleeds.   Eyes: Negative.  Negative for blurred vision, double vision and photophobia.  Respiratory: Negative.  Negative  for cough and shortness of breath.   Cardiovascular: Negative.  Negative for chest pain, palpitations and leg swelling.  Gastrointestinal: Negative.  Negative for heartburn, nausea and vomiting.  Musculoskeletal: Negative.  Negative for myalgias.  Neurological: Negative.  Negative for dizziness, focal weakness, seizures and headaches.  Psychiatric/Behavioral: Positive for depression and substance abuse. Negative for suicidal ideas. The patient is nervous/anxious.     Assessment and Plan: Billy Johnson was seen today for establish care.  Diagnoses and all orders for this visit:  Encounter to establish care  Encounter for screening for diabetes mellitus -     Hemoglobin A1c; Future  Screening for metabolic disorder -     OMB55+HRCB; Future  Alcohol abuse -     Lipase; Future -     Amylase; Future Reccommended AA meetings  Screening for deficiency anemia -     CBC; Future  Lipid screening -     Lipid panel; Future  Essential hypertension -     amLODipine (NORVASC) 5 MG tablet; Take 1 tablet (5 mg total) by mouth daily. Continue all antihypertensives as prescribed.  Remember to bring in your blood pressure log with you for your follow up appointment.  DASH/Mediterranean Diets are healthier choices for HTN.     Follow Up Instructions Return in about 4 weeks (around 05/28/2020) for HTN.     I discussed the assessment and treatment plan with the patient. The patient was provided an opportunity to ask questions and all were answered. The patient agreed with the plan and demonstrated an understanding of the instructions.   The patient was advised to call back or seek an in-person evaluation if the symptoms worsen or if the condition fails to improve as anticipated.  I provided 18 minutes of non-face-to-face time during this encounter including median intraservice time, reviewing previous notes, labs, imaging, medications and explaining diagnosis and management.  Billy Pounds, FNP-BC

## 2020-05-01 LAB — HEMOGLOBIN A1C
Est. average glucose Bld gHb Est-mCnc: 123 mg/dL
Hgb A1c MFr Bld: 5.9 % — ABNORMAL HIGH (ref 4.8–5.6)

## 2020-05-01 LAB — CMP14+EGFR
ALT: 163 IU/L — ABNORMAL HIGH (ref 0–44)
AST: 154 IU/L — ABNORMAL HIGH (ref 0–40)
Albumin/Globulin Ratio: 1.5 (ref 1.2–2.2)
Albumin: 4.4 g/dL (ref 4.1–5.2)
Alkaline Phosphatase: 70 IU/L (ref 44–121)
BUN/Creatinine Ratio: 5 — ABNORMAL LOW (ref 9–20)
BUN: 5 mg/dL — ABNORMAL LOW (ref 6–20)
Bilirubin Total: 0.9 mg/dL (ref 0.0–1.2)
CO2: 22 mmol/L (ref 20–29)
Calcium: 9.3 mg/dL (ref 8.7–10.2)
Chloride: 98 mmol/L (ref 96–106)
Creatinine, Ser: 1.02 mg/dL (ref 0.76–1.27)
GFR calc Af Amer: 115 mL/min/{1.73_m2} (ref >59)
GFR calc non Af Amer: 100 mL/min/{1.73_m2} (ref >59)
Globulin, Total: 2.9 g/dL (ref 1.5–4.5)
Glucose: 126 mg/dL — ABNORMAL HIGH (ref 65–99)
Potassium: 3.8 mmol/L (ref 3.5–5.2)
Sodium: 138 mmol/L (ref 134–144)
Total Protein: 7.3 g/dL (ref 6.0–8.5)

## 2020-05-01 LAB — CBC
Hematocrit: 45 % (ref 37.5–51.0)
Hemoglobin: 15.5 g/dL (ref 13.0–17.7)
MCH: 33.4 pg — ABNORMAL HIGH (ref 26.6–33.0)
MCHC: 34.4 g/dL (ref 31.5–35.7)
MCV: 97 fL (ref 79–97)
Platelets: 193 10*3/uL (ref 150–450)
RBC: 4.64 x10E6/uL (ref 4.14–5.80)
RDW: 13.3 % (ref 11.6–15.4)
WBC: 7.7 10*3/uL (ref 3.4–10.8)

## 2020-05-01 LAB — LIPID PANEL
Chol/HDL Ratio: 4.3 ratio (ref 0.0–5.0)
Cholesterol, Total: 204 mg/dL — ABNORMAL HIGH (ref 100–199)
HDL: 48 mg/dL (ref >39)
LDL Chol Calc (NIH): 131 mg/dL — ABNORMAL HIGH (ref 0–99)
Triglycerides: 138 mg/dL (ref 0–149)
VLDL Cholesterol Cal: 25 mg/dL (ref 5–40)

## 2020-05-01 LAB — AMYLASE: Amylase: 35 U/L (ref 31–110)

## 2020-05-01 LAB — LIPASE: Lipase: 99 U/L — ABNORMAL HIGH (ref 13–78)

## 2020-05-05 ENCOUNTER — Other Ambulatory Visit: Payer: Self-pay | Admitting: Nurse Practitioner

## 2020-05-05 DIAGNOSIS — F1010 Alcohol abuse, uncomplicated: Secondary | ICD-10-CM

## 2020-05-05 DIAGNOSIS — R748 Abnormal levels of other serum enzymes: Secondary | ICD-10-CM

## 2020-05-05 DIAGNOSIS — R7401 Elevation of levels of liver transaminase levels: Secondary | ICD-10-CM

## 2020-05-06 ENCOUNTER — Encounter: Payer: Self-pay | Admitting: Nurse Practitioner

## 2020-05-10 ENCOUNTER — Encounter: Payer: Self-pay | Admitting: Nurse Practitioner

## 2020-05-10 ENCOUNTER — Telehealth: Payer: Self-pay | Admitting: Nurse Practitioner

## 2020-05-10 NOTE — Progress Notes (Signed)
Ultrasound is schedule. Pt. Is aware of time and date.

## 2020-05-10 NOTE — Telephone Encounter (Signed)
Pt calling in regarding this referral. Pt is not happy with not being called today. Please advise .

## 2020-05-10 NOTE — Telephone Encounter (Signed)
Refer sent to  provider cma

## 2020-05-10 NOTE — Telephone Encounter (Signed)
Spoke to patient. CMA informed patient that CMA called [employer] to verify if his insurance is still active, it is terminated. Called patient to ask what insurance he have. Pt. Stated he have Medicaid. Front desk ran his medicaid and it stated he have [employer].  Pt. Was informed that the [facility] [employer] will not cover for his Ultrasound. Pt. Is aware he will be self-Pay. Pt. Understood and still would like to proceed w/ the scheduling. CMA gave patient the time and date for his Ultrasound.  Pt. Is aware he can apply for the CAFA. No other questions/Concerns.

## 2020-05-10 NOTE — Telephone Encounter (Signed)
Pt called back, he needs follow up on referral please contact and advise

## 2020-05-10 NOTE — Telephone Encounter (Signed)
Copied from CRM [id number]. Topic: General - Other >> May 09, 2020  4:09 PM Tamela Oddi wrote: Reason for CRM: Patient is returning a call regarding his referral for an ultrasound.  He did not know the name of the person who called but would like to know the status of the referral.  Please contact patient to discuss at [phone]

## 2020-05-13 NOTE — Telephone Encounter (Signed)
Pt's concern was addressed.  

## 2020-05-16 ENCOUNTER — Ambulatory Visit (HOSPITAL_COMMUNITY): Payer: Self-pay

## 2020-06-07 ENCOUNTER — Other Ambulatory Visit: Payer: Self-pay

## 2020-06-07 ENCOUNTER — Ambulatory Visit: Payer: Medicaid Other | Attending: Nurse Practitioner | Admitting: Nurse Practitioner

## 2020-06-07 ENCOUNTER — Encounter: Payer: Self-pay | Admitting: Nurse Practitioner

## 2020-06-07 ENCOUNTER — Other Ambulatory Visit: Payer: Self-pay | Admitting: Nurse Practitioner

## 2020-06-07 VITALS — BP 146/94 | HR 86 | Temp 97.5°F | Ht 69.0 in | Wt 205.0 lb

## 2020-06-07 DIAGNOSIS — I10 Essential (primary) hypertension: Secondary | ICD-10-CM

## 2020-06-07 DIAGNOSIS — Z0000 Encounter for general adult medical examination without abnormal findings: Secondary | ICD-10-CM

## 2020-06-07 DIAGNOSIS — Z1159 Encounter for screening for other viral diseases: Secondary | ICD-10-CM

## 2020-06-07 DIAGNOSIS — L209 Atopic dermatitis, unspecified: Secondary | ICD-10-CM

## 2020-06-07 DIAGNOSIS — R7401 Elevation of levels of liver transaminase levels: Secondary | ICD-10-CM

## 2020-06-07 DIAGNOSIS — Z114 Encounter for screening for human immunodeficiency virus [HIV]: Secondary | ICD-10-CM

## 2020-06-07 DIAGNOSIS — F1010 Alcohol abuse, uncomplicated: Secondary | ICD-10-CM

## 2020-06-07 MED ORDER — GABAPENTIN 300 MG PO CAPS
ORAL_CAPSULE | ORAL | 3 refills | Status: DC
Start: 2020-06-07 — End: 2020-06-07

## 2020-06-07 MED ORDER — TRIAMCINOLONE ACETONIDE 0.1 % EX CREA: 1.0000 "application " | TOPICAL_CREAM | Freq: Two times a day (BID) | CUTANEOUS | 0 refills | Status: DC

## 2020-06-07 MED ORDER — AMLODIPINE BESYLATE 10 MG PO TABS: 10.0000 mg | ORAL_TABLET | Freq: Every day | ORAL | 3 refills | Status: DC

## 2020-06-07 MED FILL — GABAPENTIN 300 MG CAPSULE: 300 | 22 days supply | Qty: 90 | Fill #0

## 2020-06-07 MED FILL — TRIAMCINOLONE 0.1% CREAM: 0.1 | 14 days supply | Qty: 60 | Fill #0

## 2020-06-07 MED FILL — AMLODIPINE BESYLATE 10 MG T: 10 | 30 days supply | Qty: 30 | Fill #0

## 2020-06-07 NOTE — Progress Notes (Signed)
Assessment & Plan:  Billy Johnson was seen today for annual exam.  Diagnoses and all orders for this visit:  Encounter for annual physical exam  Need for hepatitis C screening test -     Hepatitis C Antibody  Encounter for screening for HIV -     HIV antibody (with reflex)  Transaminitis -     Hepatic Function Panel  Alcohol abuse -     Hepatic Function Panel -     gabapentin (NEURONTIN) 300 MG capsule; Take 1 tablet three times daily. After 2 weeks increase to 2 tablets three times daily if able to tolerate.  Atopic dermatitis, unspecified type -     triamcinolone (KENALOG) 0.1 %; Apply 1 application topically 2 (two) times daily.    Patient has been counseled on age-appropriate routine health concerns for screening and prevention. These are reviewed and up-to-date. Referrals have been placed accordingly. Immunizations are up-to-date or declined.    Subjective:   Chief Complaint  Patient presents with  . Annual Exam   HPI Billy Johnson 29 y.o. male presents to office today for annual physical.   He is drinking 6-7 cans of truly every day. Has symptoms of "not feeling like himself", chills, nervousness. He would like to stop drinking. He has gone to an AA meeting recently.States his drinking started right after the onset of the pandemic.  He has an erythematous rash on the lower left side of his back. States the rash comes and goes and he treats it with OTC hydrocortisone which is somewhat effective.     Review of Systems  Constitutional: Negative for fever, malaise/fatigue and weight loss.  HENT: Negative.  Negative for nosebleeds.   Eyes: Negative.  Negative for blurred vision, double vision and photophobia.  Respiratory: Negative.  Negative for cough and shortness of breath.   Cardiovascular: Negative.  Negative for chest pain, palpitations and leg swelling.  Gastrointestinal: Negative.  Negative for heartburn, nausea and vomiting.  Genitourinary: Negative.     Musculoskeletal: Negative.  Negative for myalgias.  Skin: Positive for itching and rash.  Neurological: Negative.  Negative for dizziness, focal weakness, seizures and headaches.  Endo/Heme/Allergies: Negative.   Psychiatric/Behavioral: Positive for substance abuse. Negative for suicidal ideas.    Past Medical History:  Diagnosis Date  . Asthma     Past Surgical History:  Procedure Laterality Date  . NO PAST SURGERIES      Family History  Problem Relation Age of Onset  . Diabetes Neg Hx     Social History Reviewed with no changes to be made today.   Outpatient Medications Prior to Visit  Medication Sig Dispense Refill  . amLODipine (NORVASC) 5 MG tablet Take 1 tablet (5 mg total) by mouth daily. 90 tablet 3  . HYDROcodone-acetaminophen (NORCO) 5-325 MG tablet Take 1-2 tablets by mouth every 6 (six) hours as needed (for pain). (Patient not taking: Reported on 04/30/2020) 10 tablet 0   No facility-administered medications prior to visit.    Allergies  Allergen Reactions  . Peanuts [Peanut Oil]        Objective:    BP (!) 146/94 (BP Location: Left Arm, Patient Position: Sitting, Cuff Size: Normal)   Pulse 86   Temp (!) 97.5 F (36.4 C) (Temporal)   Ht 5\' 9"  (1.753 m)   Wt 205 lb (93 kg)   SpO2 95%   BMI 30.27 kg/m  Wt Readings from Last 3 Encounters:  06/07/20 205 lb (93 kg)  04/30/20 210 lb (  95.3 kg)  04/22/15 142 lb (64.4 kg)    Physical Exam Constitutional:      Appearance: He is well-developed.  HENT:     Head: Normocephalic and atraumatic.     Right Ear: Hearing, tympanic membrane, ear canal and external ear normal.     Left Ear: Hearing, tympanic membrane, ear canal and external ear normal.     Nose: Nose normal. No mucosal edema or rhinorrhea.     Mouth/Throat:     Dentition: Abnormal dentition.     Pharynx: Uvula midline.     Tonsils: No tonsillar exudate. 1+ on the right. 1+ on the left.  Eyes:     General: Lids are normal. No scleral  icterus.    Extraocular Movements: Extraocular movements intact.     Conjunctiva/sclera: Conjunctivae normal.     Pupils: Pupils are equal, round, and reactive to light.  Neck:     Thyroid: No thyromegaly.     Trachea: No tracheal deviation.  Cardiovascular:     Rate and Rhythm: Normal rate and regular rhythm.     Heart sounds: Normal heart sounds. No murmur heard.  No friction rub. No gallop.   Pulmonary:     Effort: Pulmonary effort is normal. No respiratory distress.     Breath sounds: Normal breath sounds. No wheezing or rales.  Chest:     Chest wall: No mass or tenderness.     Breasts:        Right: No inverted nipple, mass, nipple discharge, skin change or tenderness.        Left: No inverted nipple, mass, nipple discharge, skin change or tenderness.  Abdominal:     General: Bowel sounds are normal. There is no distension.     Palpations: Abdomen is soft. There is no mass.     Tenderness: There is no abdominal tenderness. There is no guarding or rebound.     Hernia: There is no hernia in the left inguinal area.  Musculoskeletal:        General: No tenderness or deformity. Normal range of motion.     Cervical back: Normal range of motion and neck supple.  Lymphadenopathy:     Cervical: No cervical adenopathy.     Lower Body: No right inguinal adenopathy. No left inguinal adenopathy.  Skin:    General: Skin is warm and dry.     Capillary Refill: Capillary refill takes less than 2 seconds.     Findings: Erythema and rash present. Rash is macular.       Neurological:     Mental Status: He is alert and oriented to person, place, and time.     Cranial Nerves: Cranial nerves are intact. No cranial nerve deficit.     Sensory: Sensation is intact.     Motor: Motor function is intact. No abnormal muscle tone.     Coordination: Coordination is intact. Coordination normal.     Gait: Gait is intact.     Deep Tendon Reflexes: Reflexes normal.     Reflex Scores:      Patellar  reflexes are 2+ on the right side and 2+ on the left side. Psychiatric:        Attention and Perception: Attention and perception normal.        Mood and Affect: Mood and affect normal.        Speech: Speech normal.        Behavior: Behavior normal.        Thought Content: Thought  content normal.        Cognition and Memory: Cognition and memory normal.        Judgment: Judgment normal.          Patient has been counseled extensively about nutrition and exercise as well as the importance of adherence with medications and regular follow-up. The patient was given clear instructions to go to ER or return to medical center if symptoms don't improve, worsen or new problems develop. The patient verbalized understanding.   Follow-up: Return in about 6 months (around 12/05/2020).   Claiborne Rigg, FNP-BC [facility] [place], [place] [phone]   06/07/2020, 2:24 PM

## 2020-06-08 LAB — HEPATIC FUNCTION PANEL
ALT: 175 IU/L — ABNORMAL HIGH (ref 0–44)
AST: 181 IU/L — ABNORMAL HIGH (ref 0–40)
Albumin: 4.7 g/dL (ref 4.1–5.2)
Alkaline Phosphatase: 72 IU/L (ref 44–121)
Bilirubin Total: 0.8 mg/dL (ref 0.0–1.2)
Bilirubin, Direct: 0.4 mg/dL (ref 0.00–0.40)
Total Protein: 7.5 g/dL (ref 6.0–8.5)

## 2020-06-08 LAB — HEPATITIS C ANTIBODY: Hep C Virus Ab: <0.1 s/co ratio (ref 0.0–0.9)

## 2020-06-08 LAB — HIV ANTIBODY (ROUTINE TESTING W REFLEX): HIV Screen 4th Generation wRfx: NONREACTIVE

## 2020-06-20 ENCOUNTER — Telehealth: Payer: Self-pay

## 2020-06-20 NOTE — Telephone Encounter (Signed)
CMA had informed PCP on previous encounter.

## 2020-06-20 NOTE — Telephone Encounter (Signed)
Copied from CRM [id number]. Topic: General - Other >> Jun 20, 2020 11:46 AM Wyonia Hough E wrote: Reason for CRM: Pt called about getting a call about scheduling an ultrasound/ Pt states he doesnt need it and to cancel and stop the calls for it/ please advise   Please advice

## 2020-06-20 NOTE — Telephone Encounter (Signed)
Noted  

## 2020-06-20 NOTE — Telephone Encounter (Signed)
Spoke to patient. Patient stated he do not want to get his ultrasound schedule.

## 2020-06-21 ENCOUNTER — Ambulatory Visit: Payer: Medicaid Other | Admitting: Pharmacist

## 2020-07-23 MED FILL — GABAPENTIN 300 MG CAPSULE: 300 | 22 days supply | Qty: 90 | Fill #1

## 2020-07-23 MED FILL — AMLODIPINE BESYLATE 10 MG T: 10 | 30 days supply | Qty: 30 | Fill #1

## 2020-09-17 HISTORY — PX: TOTAL HIP ARTHROPLASTY: SHX124

## 2020-10-14 ENCOUNTER — Other Ambulatory Visit: Payer: Self-pay

## 2020-10-14 ENCOUNTER — Ambulatory Visit: Payer: Self-pay | Attending: Nurse Practitioner

## 2020-10-22 ENCOUNTER — Telehealth: Payer: Self-pay | Admitting: Nurse Practitioner

## 2020-10-22 NOTE — Telephone Encounter (Signed)
Copied from CRM [id number]. Topic: General - Other >> Oct 22, 2020 12:52 PM Marylen Ponto wrote: Reason for CRM: Pt requested to speak with Mikle Bosworth. Pt requests call back.

## 2020-10-22 NOTE — Telephone Encounter (Signed)
I return Pt call, LVM inform that [facility] take up to 3 weeks top review his application , right now we are on the first week, we still waiting for them to review

## 2020-10-28 ENCOUNTER — Encounter: Payer: Self-pay | Admitting: Nurse Practitioner

## 2020-10-28 NOTE — Telephone Encounter (Signed)
Billy Johnson is this covered under the [employer] or [employer] card?

## 2020-11-11 ENCOUNTER — Other Ambulatory Visit: Payer: Self-pay

## 2020-11-11 MED FILL — Amlodipine Besylate Tab 10 MG (Base Equivalent): ORAL | 30 days supply | Qty: 30 | Fill #0 | Status: AC

## 2020-11-11 MED FILL — Gabapentin Cap 300 MG: ORAL | 15 days supply | Qty: 90 | Fill #0 | Status: AC

## 2020-11-14 ENCOUNTER — Ambulatory Visit (HOSPITAL_COMMUNITY): Payer: Self-pay | Admitting: Behavioral Health

## 2020-11-18 ENCOUNTER — Other Ambulatory Visit: Payer: Self-pay

## 2020-11-18 ENCOUNTER — Ambulatory Visit (HOSPITAL_COMMUNITY): Payer: No Payment, Other | Admitting: Behavioral Health

## 2020-11-19 ENCOUNTER — Ambulatory Visit (HOSPITAL_COMMUNITY)
Admission: RE | Admit: 2020-11-19 | Discharge: 2020-11-19 | Disposition: A | Discharge status: Home / Self Care | Payer: No Payment, Other | Attending: Psychiatry | Admitting: Psychiatry

## 2020-11-19 DIAGNOSIS — Z1330 Encounter for screening examination for mental health and behavioral disorders, unspecified: Secondary | ICD-10-CM | POA: Insufficient documentation

## 2020-11-19 NOTE — H&P (Signed)
Behavioral Health Medical Screening Exam  Billy Johnson is a 30 y.o. male patient presented to [facility] as a walk in accompanied by his partner with complaints of "I have an alcohol addiction and I need help".  Billy Johnson, 30 y.o., male patient seen face to face by this provider, consulted with Dr. Lucianne Muss; and chart reviewed on 11/19/20.   During evaluation Billy Johnson is in a sitting position in no acute distress. He is alert, oriented x 4, calm, pleasant, cooperative and attentive. He states he is anxious with congruent affect. Eyes are red and swollen. Has normal speech, and behavior Reports appetite varies and sleep is poor. Objectively there is no evidence of psychosis/mania or delusional thinking.  Patient is able to converse coherently, goal directed thoughts, no distractibility, or pre-occupation.  He also denies suicidal/self-harm/homicidal ideation, psychosis, and paranoia.  Patient answered question appropriately.    Patient states he drinks 12 tall Truly seltzers per day. States he drinks to stay calm. Sates has been drinking for years and cant stop. Reports no seizures when he has attempted to stop but states he does "feel terrible", and has fever, shivers and sweats.   Reports no history of mental illness or trauma. States he did follow up with [name] with [facility] for one visit but then was referred to [facility].   Reports he has a supportive family and partner. He works at [employer].  Current medications: Amlodipine 10 mg QD and Gabapentin (unsure of dose).    Total Time spent with patient: 20 minutes  Psychiatric Specialty Exam:  Presentation  General Appearance: Casual; Fairly Groomed  Eye Contact:Good  Speech:Clear and Coherent; Normal Rate  Speech Volume:Normal  Handedness:Right   Mood and Affect  Mood:Anxious  Affect:Appropriate; Congruent   Thought Process  Thought Processes:Coherent  Descriptions of Associations:Intact  Orientation:Full (Time,  Place and Person)  Thought Content:Logical  History of Schizophrenia/Schizoaffective disorder:No data recorded Duration of Psychotic Symptoms:No data recorded Hallucinations:Hallucinations: None  Ideas of Reference:None  Suicidal Thoughts:Suicidal Thoughts: No  Homicidal Thoughts:Homicidal Thoughts: No   Sensorium  Memory:Immediate Good; Recent Good; Remote Good  Judgment:Fair  Insight:Good   Executive Functions  Concentration:Good  Attention Span:Good  Recall:Good  Fund of Knowledge:Good  Language:Good   Psychomotor Activity  Psychomotor Activity:Psychomotor Activity: Normal   Assets  Assets:Communication Skills; Desire for Improvement; Financial Resources/Insurance; Intimacy; Physical Health; Resilience; Social Support; Transportation   Sleep  Sleep:Sleep: Fair    Physical Exam: Physical Exam Constitutional:      Appearance: Normal appearance.  HENT:     Head: Normocephalic.     Right Ear: Tympanic membrane normal.     Left Ear: Tympanic membrane normal.     Nose: Nose normal.     Mouth/Throat:     Mouth: Mucous membranes are dry.     Pharynx: Oropharynx is clear.  Eyes:     Conjunctiva/sclera: Conjunctivae normal.  Cardiovascular:     Rate and Rhythm: Normal rate.     Pulses: Normal pulses.  Pulmonary:     Effort: Pulmonary effort is normal. No respiratory distress.  Abdominal:     General: There is no distension.     Tenderness: There is no guarding.  Musculoskeletal:        General: Normal range of motion.     Cervical back: Normal range of motion.  Skin:    General: Skin is warm and dry.     Capillary Refill: Capillary refill takes less than 2 seconds.  Neurological:  Mental Status: He is alert and oriented to person, place, and time.  Psychiatric:        Attention and Perception: Attention normal.        Mood and Affect: Mood is anxious.        Speech: Speech normal.        Behavior: Behavior normal. Behavior is cooperative.         Thought Content: Thought content normal.        Cognition and Memory: Cognition normal.        Judgment: Judgment is impulsive.    Review of Systems  Constitutional: Negative.   HENT: Negative.   Eyes: Positive for redness.  Respiratory: Negative.   Cardiovascular: Negative.   Gastrointestinal: Negative.   Genitourinary: Negative.   Musculoskeletal: Negative.   Skin: Negative.   Neurological: Negative.   Endo/Heme/Allergies: Negative.   Psychiatric/Behavioral: The patient is nervous/anxious.    Blood pressure (!) 136/98, pulse 85, temperature 98.1 F (36.7 C), temperature source Oral, resp. rate 18, SpO2 100 %. There is no height or weight on file to calculate BMI.  Musculoskeletal: Strength & Muscle Tone: within normal limits Gait & Station: normal Patient leans: N/A   Recommendations:  Based on my evaluation the patient does not appear to have an emergency medical condition.   LCSW providing recourses for [facility], SW confirmed there is bed availability.   [name], NP 11/19/2020, 1:09 PM

## 2020-11-19 NOTE — BH Assessment (Incomplete)
Comprehensive Clinical Assessment (CCA) Note  11/19/2020 Billy Johnson [id number]   Patient is a 30 year old male presenting voluntarily to [facility] for assessment, requesting alcohol detox. He is accompanied by his partner, Augusto Gamble, who waits in lobby during assessment.  He reports drinking roughly 12 24 oz seltzers per day. He las drank this date PTA. Patient denies any other substance use. He denies any history of mental illness. Patient denies SI/HI/AVh, history of self-harming behavior, or previous psychiatric hospitalizations.   Per Vernard Gambles, PMHNP patient does not meet in patient care criteria. Patient provided with detox and residential substance use resources.  Chief Complaint:  Chief Complaint  Patient presents with  . Psychiatric Evaluation   Visit Diagnosis: F10.20 Alcohol use disorder, severe   CCA Biopsychosocial Intake/Chief Complaint:  NA  Current Symptoms/Problems: NA   Patient Reported Schizophrenia/Schizoaffective Diagnosis in Past: No   Strengths: NA  Preferences: NA  Abilities: NA   Type of Services Patient Feels are Needed: NA   Initial Clinical Notes/Concerns: NA   Mental Health Symptoms Depression:  Fatigue; Increase/decrease in appetite; Irritability; Sleep (too much or little)   Duration of Depressive symptoms: Greater than two weeks   Mania:  None   Anxiety:   None   Psychosis:  None   Duration of Psychotic symptoms: No data recorded  Trauma:  None   Obsessions:  None   Compulsions:  None   Inattention:  None   Hyperactivity/Impulsivity:  N/A   Oppositional/Defiant Behaviors:  N/A   Emotional Irregularity:  N/A   Other Mood/Personality Symptoms:  No data recorded   Mental Status Exam Appearance and self-care  Stature:  Average   Weight:  Average weight   Clothing:  Neat/clean   Grooming:  Normal   Cosmetic use:  None   Posture/gait:  Normal   Motor activity:  Not Remarkable   Sensorium  Attention:   Normal   Concentration:  Normal   Orientation:  X5   Recall/memory:  Normal   Affect and Mood  Affect:  Anxious; Appropriate   Mood:  Anxious   Relating  Eye contact:  Normal   Facial expression:  Anxious   Attitude toward examiner:  Cooperative   Thought and Language  Speech flow: Clear and Coherent   Thought content:  Appropriate to Mood and Circumstances   Preoccupation:  None   Hallucinations:  None   Organization:  No data recorded  [employer] of Knowledge:  Good   Intelligence:  Average   Abstraction:  Normal   Judgement:  Fair   Dance movement psychotherapist:  Realistic   Insight:  Fair   Decision Making:  Normal   Social Functioning  Social Maturity:  Responsible   Social Judgement:  Normal   Stress  Stressors:  No data recorded  Coping Ability:  Deficient supports   Skill Deficits:  None   Supports:  Family     Religion: Religion/Spirituality Are You A Religious Person?: No  Leisure/Recreation: Leisure / Recreation Do You Have Hobbies?: No  Exercise/Diet: Exercise/Diet Do You Exercise?: No Have You Gained or Lost A Significant Amount of Weight in the Past Six Months?: No Do You Follow a Special Diet?: No Do You Have Any Trouble Sleeping?: Yes Explanation of Sleeping Difficulties: reports poor sleep   CCA Employment/Education Employment/Work Situation: Employment / Work Situation Employment situation: Employed Where is patient currently employed?: [employer] How long has patient been employed?: 6 months Patient's job has been impacted by current illness:  No What is the longest time patient has a held a job?: [employer]TA Where was the patient employed at that time?: [employer] Has patient ever been in the [employer]?: No  Education: Education Is Patient Currently Attending School?: No Last Grade Completed: 12 Name of High School: NA Did 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[employer]?: Yes Did You Attend College?: No Did You Attend Graduate School?:  No Did You Have An Individualized Education Program (IIEP): No Did You Have Any Difficulty At School?: No Patient's Education Has Been Impacted by Current Illness: No   CCA Family/Childhood History Family and Relationship History: Family history Marital status: Long term relationship Long term relationship, how long?: UTA What types of issues is patient dealing with in the relationship?: patient's alcohol use Are you sexually active?: Yes What is your sexual orientation?: gay Has your sexual activity been affected by drugs, alcohol, medication, or emotional stress?: UTA Does patient have children?: No  Childhood History:  Childhood History By whom was/is the patient raised?: Both parents Additional childhood history information: grew up in [place] Description of patient's relationship with caregiver when they were a child: close, supportive Patient's description of current relationship with people who raised him/her: supportive How were you disciplined when you got in trouble as a child/adolescent?: NA Does patient have siblings?: No Did patient suffer any verbal/emotional/physical/sexual abuse as a child?: No Did patient suffer from severe childhood neglect?: No Has patient ever been sexually abused/assaulted/raped as an adolescent or adult?: No Was the patient ever a victim of a crime or a disaster?: No Witnessed domestic violence?: No Has patient been affected by domestic violence as an adult?: No  Child/Adolescent Assessment:     CCA Substance Use Alcohol/Drug Use: Alcohol / Drug Use Pain Medications: see MAR Prescriptions: see MAR Over the Counter: see MAR History of alcohol / drug use?: Yes Substance #1 Name of Substance 1: Alcohol 1 - Age of First Use: teens 1 - Amount (size/oz): 12 24 oz beers 1 - Frequency: daily 1 - Duration: 9 years 1 - Last Use / Amount: 2 drinks this date 1 - Method of Aquiring: purchased 1- Route of Use: drank                        ASAM's:  Six Dimensions of Multidimensional Assessment  Dimension 1:  Acute Intoxication and/or Withdrawal Potential:   Dimension 1:  Description of individual's past and current experiences of substance use and withdrawal: reports drinks alcohol during the day to avoid withdrawal symptoms, last drank PTA  Dimension 2:  Biomedical Conditions and Complications:   Dimension 2:  Description of patient's biomedical conditions and  complications: patient expressed concern about heath, high blood pressure  Dimension 3:  Emotional, Behavioral, or Cognitive Conditions and Complications:  Dimension 3:  Description of emotional, behavioral, or cognitive conditions and complications: no history of mental illness  Dimension 4:  Readiness to Change:  Dimension 4:  Description of Readiness to Change criteria: action  Dimension 5:  Relapse, Continued use, or Continued Problem Potential:  Dimension 5:  Relapse, continued use, or continued problem potential critiera description: expresses desire to change  Dimension 6:  Recovery/Living Environment:  Dimension 6:  Recovery/Iiving environment criteria description: lives with supportive partner, parents close by who are supportive  ASAM Severity Score: ASAM's Severity Rating Score: 6  ASAM Recommended Level of Treatment: ASAM Recommended Level of Treatment: Level II Intensive Outpatient Treatment   Substance use Disorder (SUD) Substance Use Disorder (SUD)  Checklist Symptoms of Substance Use: Continued use despite having a persistent/recurrent physical/psychological problem caused/exacerbated by use,Continued use despite persistent or recurrent social, interpersonal problems, caused or exacerbated by use,Evidence of tolerance,Evidence of withdrawal (Comment),Persistent desire or unsuccessful efforts to cut down or control use,Repeated use in physically hazardous situations,Recurrent use that results in a failure to fulfill major role obligations (work,  school, home),Presence of craving or strong urge to use,Substance(s) often taken in larger amounts or over longer times than was intended  Recommendations for Services/Supports/Treatments: Recommendations for Services/Supports/Treatments Recommendations For Services/Supports/Treatments: Detox  DSM5 Diagnoses: There are no problems to display for this patient.   Patient Centered Plan: Patient is on the following Treatment Plan(s):     Referrals to Alternative Service(s): Referred to Alternative Service(s):   Place:   Date:   Time:    Referred to Alternative Service(s):   Place:   Date:   Time:    Referred to Alternative Service(s):   Place:   Date:   Time:    Referred to Alternative Service(s):   Place:   Date:   Time:     Celedonio Miyamoto, LCSW

## 2020-11-21 ENCOUNTER — Other Ambulatory Visit: Payer: Self-pay

## 2020-11-21 ENCOUNTER — Encounter (HOSPITAL_BASED_OUTPATIENT_CLINIC_OR_DEPARTMENT_OTHER): Payer: Self-pay | Admitting: Emergency Medicine

## 2020-11-21 ENCOUNTER — Emergency Department (HOSPITAL_BASED_OUTPATIENT_CLINIC_OR_DEPARTMENT_OTHER)
Admission: EM | Admit: 2020-11-21 | Discharge: 2020-11-21 | Disposition: A | Discharge status: Home / Self Care | Payer: Medicaid Other | Attending: Emergency Medicine | Admitting: Emergency Medicine

## 2020-11-21 DIAGNOSIS — R1012 Left upper quadrant pain: Secondary | ICD-10-CM | POA: Diagnosis present

## 2020-11-21 DIAGNOSIS — Z91010 Allergy to peanuts: Secondary | ICD-10-CM | POA: Insufficient documentation

## 2020-11-21 DIAGNOSIS — Z87891 Personal history of nicotine dependence: Secondary | ICD-10-CM | POA: Diagnosis not present

## 2020-11-21 DIAGNOSIS — F10988 Alcohol use, unspecified with other alcohol-induced disorder: Secondary | ICD-10-CM | POA: Diagnosis not present

## 2020-11-21 DIAGNOSIS — J45909 Unspecified asthma, uncomplicated: Secondary | ICD-10-CM | POA: Insufficient documentation

## 2020-11-21 DIAGNOSIS — E8729 Other acidosis: Secondary | ICD-10-CM

## 2020-11-21 DIAGNOSIS — E872 Acidosis: Secondary | ICD-10-CM

## 2020-11-21 LAB — CBC WITH DIFFERENTIAL/PLATELET
Abs Immature Granulocytes: 0.01 10*3/uL (ref 0.00–0.07)
Basophils Absolute: 0 10*3/uL (ref 0.0–0.1)
Basophils Relative: 0 %
Eosinophils Absolute: 0.2 10*3/uL (ref 0.0–0.5)
Eosinophils Relative: 2 %
HCT: 42.6 % (ref 39.0–52.0)
Hemoglobin: 15.4 g/dL (ref 13.0–17.0)
Immature Granulocytes: 0 %
Lymphocytes Relative: 33 %
Lymphs Abs: 2.8 10*3/uL (ref 0.7–4.0)
MCH: 33.3 pg (ref 26.0–34.0)
MCHC: 36.2 g/dL — ABNORMAL HIGH (ref 30.0–36.0)
MCV: 92.2 fL (ref 80.0–100.0)
Monocytes Absolute: 1 10*3/uL (ref 0.1–1.0)
Monocytes Relative: 11 %
Neutro Abs: 4.4 10*3/uL (ref 1.7–7.7)
Neutrophils Relative %: 54 %
Platelets: 153 10*3/uL (ref 150–400)
RBC: 4.62 MIL/uL (ref 4.22–5.81)
RDW: 12 % (ref 11.5–15.5)
WBC: 8.4 10*3/uL (ref 4.0–10.5)
nRBC: 0 % (ref 0.0–0.2)

## 2020-11-21 LAB — HEPATIC FUNCTION PANEL
ALT: 111 U/L — ABNORMAL HIGH (ref 0–44)
AST: 123 U/L — ABNORMAL HIGH (ref 15–41)
Albumin: 4.1 g/dL (ref 3.5–5.0)
Alkaline Phosphatase: 59 U/L (ref 38–126)
Bilirubin, Direct: 0.3 mg/dL — ABNORMAL HIGH (ref 0.0–0.2)
Indirect Bilirubin: 0.7 mg/dL (ref 0.3–0.9)
Total Bilirubin: 1 mg/dL (ref 0.3–1.2)
Total Protein: 6.9 g/dL (ref 6.5–8.1)

## 2020-11-21 LAB — URINALYSIS, ROUTINE W REFLEX MICROSCOPIC
Bilirubin Urine: NEGATIVE
Glucose, UA: NEGATIVE mg/dL
Hgb urine dipstick: NEGATIVE
Ketones, ur: NEGATIVE mg/dL
Leukocytes,Ua: NEGATIVE
Nitrite: NEGATIVE
Protein, ur: 30 mg/dL — AB
Specific Gravity, Urine: <1.005 — ABNORMAL LOW (ref 1.005–1.030)
pH: 6 (ref 5.0–8.0)

## 2020-11-21 LAB — BASIC METABOLIC PANEL
Anion gap: 18 — ABNORMAL HIGH (ref 5–15)
BUN: 7 mg/dL (ref 6–20)
CO2: 20 mmol/L — ABNORMAL LOW (ref 22–32)
Calcium: 9 mg/dL (ref 8.9–10.3)
Chloride: 93 mmol/L — ABNORMAL LOW (ref 98–111)
Creatinine, Ser: 1.55 mg/dL — ABNORMAL HIGH (ref 0.61–1.24)
GFR, Estimated: >60 mL/min (ref >60)
Glucose, Bld: 127 mg/dL — ABNORMAL HIGH (ref 70–99)
Potassium: 3.3 mmol/L — ABNORMAL LOW (ref 3.5–5.1)
Sodium: 131 mmol/L — ABNORMAL LOW (ref 135–145)

## 2020-11-21 LAB — LIPASE, BLOOD: Lipase: 99 U/L — ABNORMAL HIGH (ref 11–51)

## 2020-11-21 MED ORDER — SODIUM CHLORIDE 0.9 % IV BOLUS
1000.0000 mL | Freq: Once | INTRAVENOUS | Status: AC
  Administered 2020-11-21: 1000 mL via INTRAVENOUS

## 2020-11-21 MED ORDER — LORAZEPAM 1 MG PO TABS: 0.0000 mg | ORAL_TABLET | Freq: Four times a day (QID) | ORAL | Status: DC

## 2020-11-21 MED ORDER — PANTOPRAZOLE SODIUM 40 MG PO TBEC: 40.0000 mg | DELAYED_RELEASE_TABLET | Freq: Every day | ORAL | 3 refills | Status: DC

## 2020-11-21 MED ORDER — SUCRALFATE 1 GM/10ML PO SUSP: 1.0000 g | Freq: Three times a day (TID) | ORAL | 0 refills | Status: DC

## 2020-11-21 MED ORDER — ONDANSETRON HCL 4 MG/2ML IJ SOLN
4.0000 mg | Freq: Once | INTRAMUSCULAR | Status: AC
  Administered 2020-11-21: 4 mg via INTRAVENOUS
  Filled 2020-11-21: qty 2

## 2020-11-21 MED ORDER — THIAMINE HCL 100 MG/ML IJ SOLN
100.0000 mg | Freq: Every day | INTRAMUSCULAR | Status: DC
  Administered 2020-11-21: 100 mg via INTRAVENOUS
  Filled 2020-11-21: qty 2

## 2020-11-21 MED ORDER — LORAZEPAM 2 MG/ML IJ SOLN: 0.0000 mg | Freq: Two times a day (BID) | INTRAMUSCULAR | Status: DC

## 2020-11-21 MED ORDER — ONDANSETRON HCL 4 MG PO TABS: 4.0000 mg | ORAL_TABLET | Freq: Four times a day (QID) | ORAL | 0 refills | Status: DC | PRN

## 2020-11-21 MED ORDER — CHLORDIAZEPOXIDE HCL 25 MG PO CAPS: ORAL_CAPSULE | ORAL | 0 refills | Status: DC

## 2020-11-21 MED ORDER — LORAZEPAM 1 MG PO TABS: 0.0000 mg | ORAL_TABLET | Freq: Two times a day (BID) | ORAL | Status: DC

## 2020-11-21 MED ORDER — LORAZEPAM 2 MG/ML IJ SOLN
0.0000 mg | Freq: Four times a day (QID) | INTRAMUSCULAR | Status: DC
Start: 2020-11-21 — End: 2020-11-21
  Administered 2020-11-21: 1 mg via INTRAVENOUS
  Filled 2020-11-21: qty 1

## 2020-11-21 MED ORDER — THIAMINE HCL 100 MG PO TABS: 100.0000 mg | ORAL_TABLET | Freq: Every day | ORAL | Status: DC

## 2020-11-21 NOTE — ED Provider Notes (Signed)
[facility] EMERGENCY DEPT Provider Note   CSN: [id number] Arrival date & time: 11/21/20  0430     History Chief Complaint  Patient presents with  . Alcohol Problem    Billy Johnson is a 30 y.o. male.  Patient presents to the emergency department for evaluation of left upper abdominal pain with nausea and vomiting.  Symptoms began this evening.  Patient denies hematemesis, rectal bleeding and melena.  He did have a loose stool but no frank diarrhea.  Patient does report a history of alcohol abuse, last drink was right before coming to the ER.  No history of pancreatitis, esophageal varices or peptic ulcer disease.        Past Medical History:  Diagnosis Date  . Asthma     There are no problems to display for this patient.   Past Surgical History:  Procedure Laterality Date  . NO PAST SURGERIES         Family History  Problem Relation Age of Onset  . Diabetes Neg Hx     Social History   Tobacco Use  . Smoking status: Former Games developer  . Smokeless tobacco: Current User  Vaping Use  . Vaping Use: Every day  Substance Use Topics  . Alcohol use: Yes  . Drug use: No    Home Medications Prior to Admission medications   Medication Sig Start Date End Date Taking? Authorizing Provider  amLODipine (NORVASC) 10 MG tablet TAKE 1 TABLET (10 MG TOTAL) BY MOUTH DAILY. 06/07/20 06/07/21  Claiborne Rigg, NP  gabapentin (NEURONTIN) 300 MG capsule TAKE 1 TABLET THREE TIMES DAILY. AFTER 2 WEEKS INCREASE TO 2 TABLETS THREE TIMES DAILY IF ABLE TO TOLERATE. 06/07/20 06/07/21  Claiborne Rigg, NP  HYDROcodone-acetaminophen (NORCO) 5-325 MG tablet Take 1-2 tablets by mouth every 6 (six) hours as needed (for pain). Patient not taking: Reported on 04/30/2020 04/22/15   Molpus, John, MD  triamcinolone (KENALOG) 0.1 % APPLY 1 APPLICATION TOPICALLY 2 (TWO) TIMES DAILY. 06/07/20 06/07/21  Claiborne Rigg, NP    Allergies    Peanuts [peanut oil]  Review of Systems    Review of Systems  Gastrointestinal: Positive for abdominal pain, nausea and vomiting.  All other systems reviewed and are negative.   Physical Exam Updated Vital Signs BP (!) 153/100 (BP Location: Right Arm)   Pulse 88   Temp 97.7 F (36.5 C) (Oral)   Resp 20   Ht 5\' 9"  (1.753 m)   Wt 93 kg   SpO2 96%   BMI 30.27 kg/m   Physical Exam Vitals and nursing note reviewed.  Constitutional:      General: He is not in acute distress.    Appearance: Normal appearance. He is well-developed.  HENT:     Head: Normocephalic and atraumatic.     Right Ear: Hearing normal.     Left Ear: Hearing normal.     Nose: Nose normal.  Eyes:     Conjunctiva/sclera: Conjunctivae normal.     Pupils: Pupils are equal, round, and reactive to light.  Cardiovascular:     Rate and Rhythm: Regular rhythm.     Heart sounds: S1 normal and S2 normal. No murmur heard. No friction rub. No gallop.   Pulmonary:     Effort: Pulmonary effort is normal. No respiratory distress.     Breath sounds: Normal breath sounds.  Chest:     Chest wall: No tenderness.  Abdominal:     General: Bowel sounds are normal.  Palpations: Abdomen is soft.     Tenderness: There is abdominal tenderness in the left upper quadrant. There is no guarding or rebound. Negative signs include Murphy's sign and McBurney's sign.     Hernia: No hernia is present.  Musculoskeletal:        General: Normal range of motion.     Cervical back: Normal range of motion and neck supple.  Skin:    General: Skin is warm and dry.     Findings: No rash.  Neurological:     Mental Status: He is alert and oriented to person, place, and time.     GCS: GCS eye subscore is 4. GCS verbal subscore is 5. GCS motor subscore is 6.     Cranial Nerves: No cranial nerve deficit.     Sensory: No sensory deficit.     Coordination: Coordination normal.  Psychiatric:        Speech: Speech normal.        Behavior: Behavior normal.        Thought Content:  Thought content normal.     ED Results / Procedures / Treatments   Labs (all labs ordered are listed, but only abnormal results are displayed) Labs Reviewed  CBC WITH DIFFERENTIAL/PLATELET - Abnormal; Notable for the following components:      Result Value   MCHC 36.2 (*)    All other components within normal limits  BASIC METABOLIC PANEL - Abnormal; Notable for the following components:   Sodium 131 (*)    Potassium 3.3 (*)    Chloride 93 (*)    CO2 20 (*)    Glucose, Bld 127 (*)    Creatinine, Ser 1.55 (*)    Anion gap 18 (*)    All other components within normal limits  HEPATIC FUNCTION PANEL - Abnormal; Notable for the following components:   AST 123 (*)    ALT 111 (*)    All other components within normal limits  LIPASE, BLOOD - Abnormal; Notable for the following components:   Lipase 99 (*)    All other components within normal limits  URINALYSIS, ROUTINE W REFLEX MICROSCOPIC - Abnormal; Notable for the following components:   Color, Urine COLORLESS (*)    Specific Gravity, Urine <1.005 (*)    Protein, ur 30 (*)    All other components within normal limits    EKG EKG Interpretation  Date/Time:  Thursday Nov 21 2020 05:14:30 EDT Ventricular Rate:  90 PR Interval:  167 QRS Duration: 90 QT Interval:  348 QTC Calculation: 426 R Axis:   107 Text Interpretation: Sinus rhythm Borderline right axis deviation Low voltage, extremity and precordial leads No previous tracing Confirmed by Gilda Crease [phone]) on 11/21/2020 5:17:43 AM   Radiology No results found.  Procedures Procedures   Medications Ordered in ED Medications  LORazepam (ATIVAN) injection 0-4 mg (1 mg Intravenous Given 11/21/20 0500)    Or  LORazepam (ATIVAN) tablet 0-4 mg ( Oral See Alternative 11/21/20 0500)  LORazepam (ATIVAN) injection 0-4 mg (has no administration in time range)    Or  LORazepam (ATIVAN) tablet 0-4 mg (has no administration in time range)  thiamine tablet 100 mg ( Oral  See Alternative 11/21/20 0501)    Or  thiamine (B-1) injection 100 mg (100 mg Intravenous Given 11/21/20 0501)  sodium chloride 0.9 % bolus 1,000 mL (has no administration in time range)  sodium chloride 0.9 % bolus 1,000 mL (1,000 mLs Intravenous New Bag/Given 11/21/20 0503)  ondansetron (  ZOFRAN) injection 4 mg (4 mg Intravenous Given 11/21/20 0458)    ED Course  I have reviewed the triage vital signs and the nursing notes.  Pertinent labs & imaging results that were available during my care of the patient were reviewed by me and considered in my medical decision making (see chart for details).    MDM Rules/Calculators/A&P                          Patient presents to the emergency department for evaluation of upper abdominal pain with nausea and vomiting.  Patient does have a history of alcohol abuse.  His last drink was just prior to coming to the ER.  Patient does not have any history of pancreatitis, cirrhosis, esophageal varices.  He denies any history of peptic ulcer disease.  No evidence of bleeding.  Abdominal exam revealed mild left upper quadrant tenderness, no guarding or rebound.  No signs of acute surgical process.  Normal white blood cell count.  Normal hemoglobin.  Patient does have a mild anion gap acidosis, likely secondary to alcoholic ketoacidosis.  This will be treated with IV fluids.  He was given thiamine at arrival.  Lipase is slightly elevated.  Nondiagnostic.  This appears to be his baseline.  It is similar to what was seen on routine labs in October of last year.  Will treat for possible alcoholic gastritis.  Patient actively trying to find placement in rehab.  Will provide Librium taper.  Symptomatic treatment, at this time does not require hospitalization.  Final Clinical Impression(s) / ED Diagnoses Final diagnoses:  Left upper quadrant abdominal pain  Alcoholic ketoacidosis    Rx / DC Orders ED Discharge Orders    None       Gilda Crease, MD 11/21/20  [phone]

## 2020-11-21 NOTE — ED Triage Notes (Signed)
  Patient comes in with nausea and tremors in relation to alcohol abuse.  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  Patient states he drinks 12 24 oz cans of Truly hard seltzer water daily and has been trying to cut back on his own.  Patient states he ate around 1930 and felt nauseous, woke up around 0330 shaking and nauseous.  One emesis episode but large quantity.  2/10 pain RUQ

## 2020-12-07 ENCOUNTER — Emergency Department (HOSPITAL_BASED_OUTPATIENT_CLINIC_OR_DEPARTMENT_OTHER)
Admission: EM | Admit: 2020-12-07 | Discharge: 2020-12-07 | Disposition: A | Discharge status: Home / Self Care | Payer: Medicaid Other | Attending: Emergency Medicine | Admitting: Emergency Medicine

## 2020-12-07 ENCOUNTER — Encounter (HOSPITAL_BASED_OUTPATIENT_CLINIC_OR_DEPARTMENT_OTHER): Payer: Self-pay | Admitting: Emergency Medicine

## 2020-12-07 ENCOUNTER — Ambulatory Visit (HOSPITAL_COMMUNITY)
Admission: RE | Admit: 2020-12-07 | Discharge: 2020-12-07 | Disposition: A | Discharge status: Home / Self Care | Payer: Self-pay | Source: Ambulatory Visit | Attending: Nurse Practitioner | Admitting: Nurse Practitioner

## 2020-12-07 ENCOUNTER — Other Ambulatory Visit: Payer: Self-pay

## 2020-12-07 DIAGNOSIS — I10 Essential (primary) hypertension: Secondary | ICD-10-CM | POA: Insufficient documentation

## 2020-12-07 DIAGNOSIS — Z87891 Personal history of nicotine dependence: Secondary | ICD-10-CM | POA: Insufficient documentation

## 2020-12-07 DIAGNOSIS — J45909 Unspecified asthma, uncomplicated: Secondary | ICD-10-CM | POA: Insufficient documentation

## 2020-12-07 DIAGNOSIS — M79651 Pain in right thigh: Secondary | ICD-10-CM | POA: Insufficient documentation

## 2020-12-07 DIAGNOSIS — R1030 Lower abdominal pain, unspecified: Secondary | ICD-10-CM | POA: Insufficient documentation

## 2020-12-07 DIAGNOSIS — Z91010 Allergy to peanuts: Secondary | ICD-10-CM | POA: Insufficient documentation

## 2020-12-07 HISTORY — DX: Essential (primary) hypertension: I10

## 2020-12-07 LAB — CBC WITH DIFFERENTIAL/PLATELET
Abs Immature Granulocytes: 0.02 10*3/uL (ref 0.00–0.07)
Basophils Absolute: 0 10*3/uL (ref 0.0–0.1)
Basophils Relative: 0 %
Eosinophils Absolute: 0.2 10*3/uL (ref 0.0–0.5)
Eosinophils Relative: 2 %
HCT: 43 % (ref 39.0–52.0)
Hemoglobin: 15.1 g/dL (ref 13.0–17.0)
Immature Granulocytes: 0 %
Lymphocytes Relative: 20 %
Lymphs Abs: 2.4 10*3/uL (ref 0.7–4.0)
MCH: 32.8 pg (ref 26.0–34.0)
MCHC: 35.1 g/dL (ref 30.0–36.0)
MCV: 93.3 fL (ref 80.0–100.0)
Monocytes Absolute: 0.6 10*3/uL (ref 0.1–1.0)
Monocytes Relative: 5 %
Neutro Abs: 8.4 10*3/uL — ABNORMAL HIGH (ref 1.7–7.7)
Neutrophils Relative %: 73 %
Platelets: 335 10*3/uL (ref 150–400)
RBC: 4.61 MIL/uL (ref 4.22–5.81)
RDW: 11.9 % (ref 11.5–15.5)
WBC: 11.6 10*3/uL — ABNORMAL HIGH (ref 4.0–10.5)
nRBC: 0 % (ref 0.0–0.2)

## 2020-12-07 LAB — COMPREHENSIVE METABOLIC PANEL
ALT: 79 U/L — ABNORMAL HIGH (ref 0–44)
AST: 61 U/L — ABNORMAL HIGH (ref 15–41)
Albumin: 4.1 g/dL (ref 3.5–5.0)
Alkaline Phosphatase: 49 U/L (ref 38–126)
Anion gap: 10 (ref 5–15)
BUN: 12 mg/dL (ref 6–20)
CO2: 23 mmol/L (ref 22–32)
Calcium: 9.3 mg/dL (ref 8.9–10.3)
Chloride: 102 mmol/L (ref 98–111)
Creatinine, Ser: 0.85 mg/dL (ref 0.61–1.24)
GFR, Estimated: >60 mL/min (ref >60)
Glucose, Bld: 91 mg/dL (ref 70–99)
Potassium: 4.3 mmol/L (ref 3.5–5.1)
Sodium: 135 mmol/L (ref 135–145)
Total Bilirubin: 0.9 mg/dL (ref 0.3–1.2)
Total Protein: 7.3 g/dL (ref 6.5–8.1)

## 2020-12-07 LAB — CK: Total CK: 48 U/L — ABNORMAL LOW (ref 49–397)

## 2020-12-07 MED ORDER — METHOCARBAMOL 500 MG PO TABS: 500.0000 mg | ORAL_TABLET | Freq: Two times a day (BID) | ORAL | 0 refills | Status: DC

## 2020-12-07 NOTE — ED Triage Notes (Signed)
Pt felt like he pulled muscle in his right groin about 3 weeks ago and is getting worse. Hurts when he walks or stands on his leg. No obvious deformity.

## 2020-12-07 NOTE — ED Provider Notes (Signed)
Care transferred to me.  Has had thigh pain for quite some time.  Labs are unremarkable besides mild LFT abnormalities and mild leukocytosis.  CK is not indicative of a acute muscle inflammation/rhabdomyolysis.  Discharged home to follow-up with sports medicine as previously discussed with Dr. Rhunette Croft.   Pricilla Loveless, MD 12/07/20 [phone]

## 2020-12-08 NOTE — ED Provider Notes (Signed)
[facility] EMERGENCY DEPT Provider Note   CSN: [id number] Arrival date & time: 12/07/20  1213     History Chief Complaint  Patient presents with  . Groin Pain    Billy Johnson is a 30 y.o. male.  HPI    30 year old comes in a chief complaint of groin pain.  Patient reports that he has been having pain in his groin for the last several weeks.  He suspects that he might have twisted his leg while at work, as at his work he has to push heavy carts.  Initially the pain was nagging, but over the last 2 or 3 weeks the pain is worse.  He is having difficulty with things like getting in and out of car and even at work with simpler tasks.  No rash.  Patient denies any numbness, tingling.  Pain is present only with activation of thigh muscle and it alleviates when he is resting.  Patient used to drink heavily but denies any accidents that he can think of.  He denies any drug use.  Currently taking ibuprofen 800 mg with transient relief.   Past Medical History:  Diagnosis Date  . Asthma   . Hypertension     There are no problems to display for this patient.   Past Surgical History:  Procedure Laterality Date  . NO PAST SURGERIES         Family History  Problem Relation Age of Onset  . Diabetes Neg Hx     Social History   Tobacco Use  . Smoking status: Former Games developer  . Smokeless tobacco: Current User  Vaping Use  . Vaping Use: Every day  Substance Use Topics  . Alcohol use: Not Currently    Comment: stopped 3 weeks ago  . Drug use: No    Home Medications Prior to Admission medications   Medication Sig Start Date End Date Taking? Authorizing Provider  amLODipine (NORVASC) 10 MG tablet TAKE 1 TABLET (10 MG TOTAL) BY MOUTH DAILY. 06/07/20 06/07/21 Yes Claiborne Rigg, NP  chlordiazePOXIDE (LIBRIUM) 25 MG capsule 50mg  PO TID x 1D, then 25-50mg  PO BID X 1D, then 25-50mg  PO QD X 1D 11/21/20  Yes Pollina, 01/21/21, MD  gabapentin (NEURONTIN) 300 MG  capsule TAKE 1 TABLET THREE TIMES DAILY. AFTER 2 WEEKS INCREASE TO 2 TABLETS THREE TIMES DAILY IF ABLE TO TOLERATE. 06/07/20 06/07/21 Yes 06/09/21, NP  methocarbamol (ROBAXIN) 500 MG tablet Take 1 tablet (500 mg total) by mouth 2 (two) times daily. 12/07/20  Yes Monroe Toure, MD  pantoprazole (PROTONIX) 40 MG tablet Take 1 tablet (40 mg total) by mouth daily. 11/21/20  Yes Pollina, 01/21/21, MD  sucralfate (CARAFATE) 1 GM/10ML suspension Take 10 mLs (1 g total) by mouth 4 (four) times daily -  with meals and at bedtime. 11/21/20  Yes Pollina, 01/21/21, MD  HYDROcodone-acetaminophen (NORCO) 5-325 MG tablet Take 1-2 tablets by mouth every 6 (six) hours as needed (for pain). Patient not taking: No sig reported 04/22/15   Molpus, 06/22/15, MD  ondansetron (ZOFRAN) 4 MG tablet Take 1 tablet (4 mg total) by mouth every 6 (six) hours as needed for nausea or vomiting. 11/21/20   Pollina, 01/21/21, MD  triamcinolone (KENALOG) 0.1 % APPLY 1 APPLICATION TOPICALLY 2 (TWO) TIMES DAILY. 06/07/20 06/07/21  06/09/21, NP    Allergies    Peanuts [peanut oil]  Review of Systems   Review of Systems  Constitutional: Positive for activity change.  Musculoskeletal: Positive for arthralgias.  Skin: Negative for rash and wound.  Allergic/Immunologic: Negative for immunocompromised state.  Neurological: Negative for numbness.  Hematological: Does not bruise/bleed easily.    Physical Exam Updated Vital Signs BP 129/86 (BP Location: Right Arm)   Pulse 91   Temp 97.9 F (36.6 C) (Oral)   Resp 17   SpO2 95%   Physical Exam Vitals and nursing note reviewed.  Constitutional:      Appearance: He is well-developed.  HENT:     Head: Atraumatic.  Cardiovascular:     Rate and Rhythm: Normal rate.  Pulmonary:     Effort: Pulmonary effort is normal.  Musculoskeletal:        General: Tenderness present.     Cervical back: Neck supple.     Comments: Tenderness over the medial and lateral  aspect of the right leg proximally.  Patient does not have any inguinal lymphadenopathy.  No hernia appreciated.  Skin:    General: Skin is warm.  Neurological:     Mental Status: He is alert and oriented to person, place, and time.     ED Results / Procedures / Treatments   Labs (all labs ordered are listed, but only abnormal results are displayed) Labs Reviewed  CBC WITH DIFFERENTIAL/PLATELET - Abnormal; Notable for the following components:      Result Value   WBC 11.6 (*)    Neutro Abs 8.4 (*)    All other components within normal limits  COMPREHENSIVE METABOLIC PANEL - Abnormal; Notable for the following components:   AST 61 (*)    ALT 79 (*)    All other components within normal limits  CK - Abnormal; Notable for the following components:   Total CK 48 (*)    All other components within normal limits    EKG None  Radiology No results found.  Procedures Procedures   Medications Ordered in ED Medications - No data to display  ED Course  I have reviewed the triage vital signs and the nursing notes.  Pertinent labs & imaging results that were available during my care of the patient were reviewed by me and considered in my medical decision making (see chart for details).    MDM Rules/Calculators/A&P                          Patient comes in a chief complaint of thigh pain.  Pain is present over the proximal part of the thigh and in the groin area.  No signs of femoral hernia.  No signs of infection.  Basic labs ordered including CK.  Genital exam was not performed, but patient denies any scrotal pain or swelling.  Pain indeed activated with any active hip flexion.  Will advise RICE and also have him follow-up with sports medicine or PCP.  Final Clinical Impression(s) / ED Diagnoses Final diagnoses:  Right thigh pain    Rx / DC Orders ED Discharge Orders         Ordered    methocarbamol (ROBAXIN) 500 MG tablet  2 times daily        12/07/20 1605            Derwood Kaplan, MD 12/08/20 [phone]

## 2020-12-23 ENCOUNTER — Other Ambulatory Visit: Payer: Self-pay

## 2020-12-23 MED FILL — Amlodipine Besylate Tab 10 MG (Base Equivalent): ORAL | 30 days supply | Qty: 30 | Fill #1 | Status: AC

## 2020-12-23 MED FILL — Gabapentin Cap 300 MG: ORAL | 15 days supply | Qty: 90 | Fill #1 | Status: AC

## 2020-12-24 ENCOUNTER — Other Ambulatory Visit: Payer: Self-pay

## 2021-01-27 ENCOUNTER — Other Ambulatory Visit: Payer: Self-pay | Admitting: Nurse Practitioner

## 2021-01-27 ENCOUNTER — Other Ambulatory Visit: Payer: Self-pay

## 2021-01-27 DIAGNOSIS — F1010 Alcohol abuse, uncomplicated: Secondary | ICD-10-CM

## 2021-01-27 MED FILL — Amlodipine Besylate Tab 10 MG (Base Equivalent): ORAL | 30 days supply | Qty: 30 | Fill #2 | Status: AC

## 2021-01-28 MED ORDER — GABAPENTIN 300 MG PO CAPS
300.0000 mg | ORAL_CAPSULE | Freq: Three times a day (TID) | ORAL | 3 refills | Status: DC
  Filled 2021-01-28: qty 90, 30d supply, fill #0
  Filled 2021-03-20: qty 90, 30d supply, fill #1
  Filled 2021-05-01: qty 90, 30d supply, fill #2
  Filled 2021-06-01: qty 90, 30d supply, fill #3

## 2021-01-28 NOTE — Telephone Encounter (Signed)
Requested medications are due for refill today yes  Requested medications are on the active medication list yes  Last refill 6/7  Last visit 05/2020  Future visit scheduled no  Notes to clinic No signature/instructions, please assess.

## 2021-01-29 ENCOUNTER — Other Ambulatory Visit: Payer: Self-pay

## 2021-02-03 ENCOUNTER — Other Ambulatory Visit: Payer: Self-pay

## 2021-03-20 ENCOUNTER — Other Ambulatory Visit: Payer: Self-pay

## 2021-03-20 MED FILL — Amlodipine Besylate Tab 10 MG (Base Equivalent): ORAL | 30 days supply | Qty: 30 | Fill #3 | Status: AC

## 2021-03-21 ENCOUNTER — Other Ambulatory Visit: Payer: Self-pay

## 2021-05-01 ENCOUNTER — Other Ambulatory Visit: Payer: Self-pay

## 2021-05-01 MED FILL — Amlodipine Besylate Tab 10 MG (Base Equivalent): ORAL | 30 days supply | Qty: 30 | Fill #4 | Status: AC

## 2021-05-02 ENCOUNTER — Other Ambulatory Visit: Payer: Self-pay

## 2021-06-01 MED FILL — Amlodipine Besylate Tab 10 MG (Base Equivalent): ORAL | 30 days supply | Qty: 30 | Fill #5 | Status: AC

## 2021-06-02 ENCOUNTER — Other Ambulatory Visit: Payer: Self-pay

## 2021-06-04 ENCOUNTER — Other Ambulatory Visit: Payer: Self-pay

## 2021-07-18 ENCOUNTER — Other Ambulatory Visit: Payer: Self-pay | Admitting: Nurse Practitioner

## 2021-07-18 ENCOUNTER — Ambulatory Visit: Payer: Self-pay | Attending: Internal Medicine | Admitting: Internal Medicine

## 2021-07-18 ENCOUNTER — Other Ambulatory Visit: Payer: Self-pay

## 2021-07-18 VITALS — BP 131/78 | HR 80 | Ht 68.0 in | Wt 209.8 lb

## 2021-07-18 DIAGNOSIS — Z01818 Encounter for other preprocedural examination: Secondary | ICD-10-CM

## 2021-07-18 DIAGNOSIS — Z720 Tobacco use: Secondary | ICD-10-CM | POA: Insufficient documentation

## 2021-07-18 DIAGNOSIS — F1010 Alcohol abuse, uncomplicated: Secondary | ICD-10-CM

## 2021-07-18 DIAGNOSIS — Z6831 Body mass index (BMI) 31.0-31.9, adult: Secondary | ICD-10-CM

## 2021-07-18 DIAGNOSIS — F1091 Alcohol use, unspecified, in remission: Secondary | ICD-10-CM

## 2021-07-18 DIAGNOSIS — I10 Essential (primary) hypertension: Secondary | ICD-10-CM

## 2021-07-18 DIAGNOSIS — M87051 Idiopathic aseptic necrosis of right femur: Secondary | ICD-10-CM | POA: Insufficient documentation

## 2021-07-18 DIAGNOSIS — F1021 Alcohol dependence, in remission: Secondary | ICD-10-CM | POA: Insufficient documentation

## 2021-07-18 DIAGNOSIS — F17290 Nicotine dependence, other tobacco product, uncomplicated: Secondary | ICD-10-CM

## 2021-07-18 DIAGNOSIS — E669 Obesity, unspecified: Secondary | ICD-10-CM

## 2021-07-18 MED ORDER — GABAPENTIN 300 MG PO CAPS
300.0000 mg | ORAL_CAPSULE | Freq: Three times a day (TID) | ORAL | 3 refills | Status: DC
  Filled 2021-07-18 – 2021-09-03 (×2): qty 90, 30d supply, fill #0
  Filled 2021-09-03: qty 90, 30d supply, fill #1
  Filled 2021-09-11: qty 90, 15d supply, fill #0
  Filled 2021-10-06: qty 90, 15d supply, fill #1
  Filled 2021-11-03: qty 90, 15d supply, fill #2

## 2021-07-18 MED ORDER — VARENICLINE TARTRATE 0.5 MG X 11 & 1 MG X 42 PO TBPK
ORAL_TABLET | ORAL | 0 refills | Status: DC
  Filled 2021-07-18: qty 53, 28d supply, fill #0

## 2021-07-18 MED ORDER — AMLODIPINE BESYLATE 10 MG PO TABS
ORAL_TABLET | Freq: Every day | ORAL | 1 refills | Status: DC
  Filled 2021-07-18: qty 30, 30d supply, fill #0
  Filled 2021-09-03: qty 30, 30d supply, fill #1
  Filled 2021-09-03 – 2021-09-11 (×2): qty 30, 30d supply, fill #0
  Filled 2021-10-06: qty 30, 30d supply, fill #1
  Filled 2021-11-03: qty 30, 30d supply, fill #2
  Filled 2021-11-25 – 2021-11-26 (×2): qty 30, 30d supply, fill #3
  Filled 2021-12-15: qty 30, 30d supply, fill #4

## 2021-07-18 NOTE — Progress Notes (Signed)
Patient ID: Billy Johnson, male    DOB: [birth year]  MRN: [id number]  CC: pre-op eval.  Subjective: Billy Johnson is a 30 y.o. male who presents for pre-op eval for hip replacement surgery.  His concerns today include:  Pt with hx of HTN,  Hx of ETOH abuse  Pt will be having RT THR due to AVN by Dr. Constance Goltz at [facility]. No previous surgeries. Able to walk 2-3 blocks at a steady pace.  No CP/SOB with exertion. No chronic HA/dizziness.  No LE edema.  No recent cough or UR symptoms Uses nicotine vaping product throughout the day. Vaped for the past few yrs.  Reports he never smoked cigarettes.  Denies street drug use.  Pt with hx of ETOH use disorder.  Reports he quit for 6-7 mths.  Now "I have one beer on occasion."  Last drank at Christmas.   Pt reports he was on BP med when he was drinking.   Takes Norvasc occasionally.  Has not check BP in a while.  Limits salt in foods.  Based on body mass index, he is obese for height.  Not able to do much in terms of exercise due to the issue with his right hip.  Overall he thinks he eats healthy.  He naturally drinks a lot of water and urinates throughout the day because of that.  No family history of diabetes.    Current Outpatient Medications on File Prior to Visit  Medication Sig Dispense Refill   amLODipine (NORVASC) 10 MG tablet TAKE 1 TABLET (10 MG TOTAL) BY MOUTH DAILY. 90 tablet 3   gabapentin (NEURONTIN) 300 MG capsule Take 1 capsule (300 mg total) by mouth 3 (three) times daily after 2 weeks increase to 2 capsules three times daily if able to tolerate. 90 capsule 3   No current facility-administered medications on file prior to visit.    Allergies  Allergen Reactions   Peanuts [Peanut Oil]     Social History   Socioeconomic History   Marital status: Single    Spouse name: Not on file   Number of children: Not on file   Years of education: Not on file   Highest education level: Not on file  Occupational History   Not on  file  Tobacco Use   Smoking status: Former   Smokeless tobacco: Current  Vaping Use   Vaping Use: Every day  Substance and Sexual Activity   Alcohol use: Not Currently    Comment: stopped 3 weeks ago   Drug use: No   Sexual activity: Yes  Other Topics Concern   Not on file  Social History Narrative   Not on file   Social Determinants of Health   Financial Resource Strain: Not on file  Food Insecurity: Not on file  Transportation Needs: Not on file  Physical Activity: Not on file  Stress: Not on file  Social Connections: Not on file  Intimate Partner Violence: Not on file    Family History  Problem Relation Age of Onset   Diabetes Neg Hx     Past Surgical History:  Procedure Laterality Date   NO PAST SURGERIES      ROS: Review of Systems Negative except as stated above  PHYSICAL EXAM: BP 131/78    Pulse 80    Ht 5\' 8"  (1.727 m)    Wt 209 lb 12.8 oz (95.2 kg)    SpO2 97%    BMI 31.90 kg/m   Physical Exam  General appearance - alert, well appearing, and in no distress Mental status - normal mood, behavior, speech, dress, motor activity, and thought processes Eyes -Pink conjunctiva. Nose - normal and patent, no erythema, discharge or polyps Mouth - mucous membranes moist, pharynx normal without lesions Neck - supple, no significant adenopathy Chest - clear to auscultation, no wheezes, rales or rhonchi, symmetric air entry Heart - normal rate, regular rhythm, normal S1, S2, no murmurs, rubs, clicks or gallops Abdomen - soft, nontender, nondistended, no masses or organomegaly Extremities - peripheral pulses normal, no pedal edema, no clubbing or cyanosis  CMP Latest Ref Rng & Units 12/07/2020 11/21/2020 06/07/2020  Glucose 70 - 99 mg/dL 91 250(N) -  BUN 6 - 20 mg/dL 12 7 -  Creatinine 3.97 - 1.24 mg/dL 6.73 4.19(F) -  Sodium 135 - 145 mmol/L 135 131(L) -  Potassium 3.5 - 5.1 mmol/L 4.3 3.3(L) -  Chloride 98 - 111 mmol/L 102 93(L) -  CO2 22 - 32 mmol/L 23 20(L) -   Calcium 8.9 - 10.3 mg/dL 9.3 9.0 -  Total Protein 6.5 - 8.1 g/dL 7.3 6.9 7.5  Total Bilirubin 0.3 - 1.2 mg/dL 0.9 1.0 0.8  Alkaline Phos 38 - 126 U/L 49 59 72  AST 15 - 41 U/L 61(H) 123(H) 181(H)  ALT 0 - 44 U/L 79(H) 111(H) 175(H)   Lipid Panel     Component Value Date/Time   CHOL 204 (H) 04/30/2020 1204   TRIG 138 04/30/2020 1204   HDL 48 04/30/2020 1204   CHOLHDL 4.3 04/30/2020 1204   LDLCALC 131 (H) 04/30/2020 1204    CBC    Component Value Date/Time   WBC 11.6 (H) 12/07/2020 1503   RBC 4.61 12/07/2020 1503   HGB 15.1 12/07/2020 1503   HGB 15.5 04/30/2020 1204   HCT 43.0 12/07/2020 1503   HCT 45.0 04/30/2020 1204   PLT 335 12/07/2020 1503   PLT 193 04/30/2020 1204   MCV 93.3 12/07/2020 1503   MCV 97 04/30/2020 1204   MCH 32.8 12/07/2020 1503   MCHC 35.1 12/07/2020 1503   RDW 11.9 12/07/2020 1503   RDW 13.3 04/30/2020 1204   LYMPHSABS 2.4 12/07/2020 1503   MONOABS 0.6 12/07/2020 1503   EOSABS 0.2 12/07/2020 1503   BASOSABS 0.0 12/07/2020 1503    ASSESSMENT AND PLAN:  1. Preoperative evaluation to rule out surgical contraindication 2. Avascular necrosis of hip, right (HCC) Patient at low risk for surgery.  However advised that orthopedics does require that he be free of smoking nicotine products for at least 4 weeks prior to surgery.  Patient states he wants to quit at this time.  3. Essential hypertension Close to goal.  We will hold off on restarting amlodipine.  DASH diet discussed and encouraged. - Comprehensive metabolic panel  4. Vapes nicotine containing substance Advised to quit.  Patient would like to give a trial of quitting.  We discussed methods to help him quit including use of nicotine patches/gum, Chantix, Wellbutrin.  Went over possible side effects of each.  Patient wanting to try the Chantix.  Advised that Chantix can cause mood swings and bad dreams.  No issue with depression or anxiety at this time.  Advised that if he develops mood swings on  the medicine he should stop it and let me know.  Went over starter pack versus continuation pack with goal of maintaining him on the medication for about 3 months.  We will reassess progress in 1 month. - varenicline (CHANTIX  PAK) 0.5 MG X 11 & 1 MG X 42 tablet; Take one 0.5 mg tablet by mouth once daily for 3 days, then increase to one 0.5 mg tablet twice daily for 4 days, then increase to one 1 mg tablet twice daily.  Dispense: 53 tablet; Refill: 0  5. Alcohol use disorder in remission Commended him on this.  Encouraged him to be free of alcoholic beverages completely.  Patient chooses at this time to drink occasionally.  Advised of risks of delirium tremors if he actively drinks and goes into surgery.  He expressed understanding. - Comprehensive metabolic panel  6. Obesity (BMI 30.0-34.9) Patient advised to eliminate sugary drinks from the diet, cut back on portion sizes especially of white carbohydrates, and incorporate fresh fruits and vegetables into the diet daily. - Hemoglobin A1c    Patient was given the opportunity to ask questions.  Patient verbalized understanding of the plan and was able to repeat key elements of the plan.   Orders Placed This Encounter  Procedures   Comprehensive metabolic panel   Hemoglobin A1c     Requested Prescriptions   Signed Prescriptions Disp Refills   varenicline (CHANTIX PAK) 0.5 MG X 11 & 1 MG X 42 tablet 53 tablet 0    Sig: Take one 0.5 mg tablet by mouth once daily for 3 days, then increase to one 0.5 mg tablet twice daily for 4 days, then increase to one 1 mg tablet twice daily.    Return in about 1 month (around 08/18/2021).  Jonah Blue, MD, FACP

## 2021-07-18 NOTE — Telephone Encounter (Signed)
Requested Prescriptions  Pending Prescriptions Disp Refills   amLODipine (NORVASC) 10 MG tablet 90 tablet 1    Sig: TAKE 1 TABLET (10 MG TOTAL) BY MOUTH DAILY.     Cardiovascular:  Calcium Channel Blockers Passed - 07/18/2021 10:06 AM      Passed - Last BP in normal range    BP Readings from Last 1 Encounters:  07/18/21 131/78         Passed - Valid encounter within last 6 months    Recent Outpatient Visits          Today Preoperative evaluation to rule out surgical contraindication   [facility] Marcine Matar, MD   1 year ago Encounter for annual physical exam   [facility] [place], Shea Stakes, NP   1 year ago Encounter to establish care   [facility] [place], [place] W, NP              gabapentin (NEURONTIN) 300 MG capsule 90 capsule 3    Sig: Take 1 capsule (300 mg total) by mouth 3 (three) times daily after 2 weeks increase to 2 capsules three times daily if able to tolerate.     Neurology: Anticonvulsants - gabapentin Passed - 07/18/2021 10:06 AM      Passed - Valid encounter within last 12 months    Recent Outpatient Visits          Today Preoperative evaluation to rule out surgical contraindication   [facility] Marcine Matar, MD   1 year ago Encounter for annual physical exam   [facility] Claiborne Rigg, NP   1 year ago Encounter to establish care   [facility] Claiborne Rigg, NP

## 2021-07-18 NOTE — Patient Instructions (Signed)
I have sent the prescription for the Chantix to our pharmacy.  This is the use starter pack.  Once you have completed the first month of it, please call so that we can send the continuation pack which she will take for an additional 2 months.  Chantix can cause bad dreams and mood swings.  If you develop significant mood swings please stop the medication and let me know.  Healthy Eating Following a healthy eating pattern may help you to achieve and maintain a healthy body weight, reduce the risk of chronic disease, and live a long and productive life. It is important to follow a healthy eating pattern at an appropriate calorie level for your body. Your nutritional needs should be met primarily through food by choosing a variety of nutrient-rich foods. What are tips for following this plan? Reading food labels Read labels and choose the following: Reduced or low sodium. Juices with 100% fruit juice. Foods with low saturated fats and high polyunsaturated and monounsaturated fats. Foods with whole grains, such as whole wheat, cracked wheat, brown rice, and wild rice. Whole grains that are fortified with folic acid. This is recommended for women who are pregnant or who want to become pregnant. Read labels and avoid the following: Foods with a lot of added sugars. These include foods that contain brown sugar, corn sweetener, corn syrup, dextrose, fructose, glucose, high-fructose corn syrup, honey, invert sugar, lactose, malt syrup, maltose, molasses, raw sugar, sucrose, trehalose, or turbinado sugar. Do not eat more than the following amounts of added sugar per day: 6 teaspoons (25 g) for women. 9 teaspoons (38 g) for men. Foods that contain processed or refined starches and grains. Refined grain products, such as white flour, degermed cornmeal, white bread, and white rice. Shopping Choose nutrient-rich snacks, such as vegetables, whole fruits, and nuts. Avoid high-calorie and high-sugar snacks, such  as potato chips, fruit snacks, and candy. Use oil-based dressings and spreads on foods instead of solid fats such as butter, stick margarine, or cream cheese. Limit pre-made sauces, mixes, and "instant" products such as flavored rice, instant noodles, and ready-made pasta. Try more plant-protein sources, such as tofu, tempeh, black beans, edamame, lentils, nuts, and seeds. Explore eating plans such as the Mediterranean diet or vegetarian diet. Cooking Use oil to saut or stir-fry foods instead of solid fats such as butter, stick margarine, or lard. Try baking, boiling, grilling, or broiling instead of frying. Remove the fatty part of meats before cooking. Steam vegetables in water or broth. Meal planning  At meals, imagine dividing your plate into fourths: One-half of your plate is fruits and vegetables. One-fourth of your plate is whole grains. One-fourth of your plate is protein, especially lean meats, poultry, eggs, tofu, beans, or nuts. Include low-fat dairy as part of your daily diet. Lifestyle Choose healthy options in all settings, including home, work, school, restaurants, or stores. Prepare your food safely: Wash your hands after handling raw meats. Keep food preparation surfaces clean by regularly washing with hot, soapy water. Keep raw meats separate from ready-to-eat foods, such as fruits and vegetables. Cook seafood, meat, poultry, and eggs to the recommended internal temperature. Store foods at safe temperatures. In general: Keep cold foods at 64F (4.4C) or below. Keep hot foods at 164F (60C) or above. Keep your freezer at [facility] (-17.8C) or below. Foods are no longer safe to eat when they have been between the temperatures of 40-164F (4.4-60C) for more than 2 hours. What foods should I eat? Fruits Aim  to eat 2 cup-equivalents of fresh, canned (in natural juice), or frozen fruits each day. Examples of 1 cup-equivalent of fruit include 1 small apple, 8 large  strawberries, 1 cup canned fruit,  cup dried fruit, or 1 cup 100% juice. Vegetables Aim to eat 2-3 cup-equivalents of fresh and frozen vegetables each day, including different varieties and colors. Examples of 1 cup-equivalent of vegetables include 2 medium carrots, 2 cups raw, leafy greens, 1 cup chopped vegetable (raw or cooked), or 1 medium baked potato. Grains Aim to eat 6 ounce-equivalents of whole grains each day. Examples of 1 ounce-equivalent of grains include 1 slice of bread, 1 cup ready-to-eat cereal, 3 cups popcorn, or  cup cooked rice, pasta, or cereal. Meats and other proteins Aim to eat 5-6 ounce-equivalents of protein each day. Examples of 1 ounce-equivalent of protein include 1 egg, 1/2 cup nuts or seeds, or 1 tablespoon (16 g) peanut butter. A cut of meat or fish that is the size of a deck of cards is about 3-4 ounce-equivalents. Of the protein you eat each week, try to have at least 8 ounces come from seafood. This includes salmon, trout, herring, and anchovies. Dairy Aim to eat 3 cup-equivalents of fat-free or low-fat dairy each day. Examples of 1 cup-equivalent of dairy include 1 cup (240 mL) milk, 8 ounces (250 g) yogurt, 1 ounces (44 g) natural cheese, or 1 cup (240 mL) fortified soy milk. Fats and oils Aim for about 5 teaspoons (21 g) per day. Choose monounsaturated fats, such as canola and olive oils, avocados, peanut butter, and most nuts, or polyunsaturated fats, such as sunflower, corn, and soybean oils, walnuts, pine nuts, sesame seeds, sunflower seeds, and flaxseed. Beverages Aim for six 8-oz glasses of water per day. Limit coffee to three to five 8-oz cups per day. Limit caffeinated beverages that have added calories, such as soda and energy drinks. Limit alcohol intake to no more than 1 drink a day for nonpregnant women and 2 drinks a day for men. One drink equals 12 oz of beer (355 mL), 5 oz of wine (148 mL), or 1 oz of hard liquor (44 mL). Seasoning and other  foods Avoid adding excess amounts of salt to your foods. Try flavoring foods with herbs and spices instead of salt. Avoid adding sugar to foods. Try using oil-based dressings, sauces, and spreads instead of solid fats. This information is based on general [place] nutrition guidelines. For more information, visit [employer]. Exact amounts may vary based on your nutrition needs. Summary A healthy eating plan may help you to maintain a healthy weight, reduce the risk of chronic diseases, and stay active throughout your life. Plan your meals. Make sure you eat the right portions of a variety of nutrient-rich foods. Try baking, boiling, grilling, or broiling instead of frying. Choose healthy options in all settings, including home, work, school, restaurants, or stores. This information is not intended to replace advice given to you by your health care provider. Make sure you discuss any questions you have with your health care provider. Document Revised: 03/04/2021 Document Reviewed: 03/04/2021 Elsevier Patient Education  [place].

## 2021-07-19 LAB — COMPREHENSIVE METABOLIC PANEL
ALT: 15 IU/L (ref 0–44)
AST: 14 IU/L (ref 0–40)
Albumin/Globulin Ratio: 2 (ref 1.2–2.2)
Albumin: 4.7 g/dL (ref 4.1–5.2)
Alkaline Phosphatase: 77 IU/L (ref 44–121)
BUN/Creatinine Ratio: 14 (ref 9–20)
BUN: 13 mg/dL (ref 6–20)
Bilirubin Total: 0.4 mg/dL (ref 0.0–1.2)
CO2: 22 mmol/L (ref 20–29)
Calcium: 9.5 mg/dL (ref 8.7–10.2)
Chloride: 101 mmol/L (ref 96–106)
Creatinine, Ser: 0.95 mg/dL (ref 0.76–1.27)
Globulin, Total: 2.4 g/dL (ref 1.5–4.5)
Glucose: 95 mg/dL (ref 70–99)
Potassium: 4.6 mmol/L (ref 3.5–5.2)
Sodium: 139 mmol/L (ref 134–144)
Total Protein: 7.1 g/dL (ref 6.0–8.5)
eGFR: 110 mL/min/{1.73_m2} (ref >59)

## 2021-07-19 LAB — HEMOGLOBIN A1C
Est. average glucose Bld gHb Est-mCnc: 108 mg/dL
Hgb A1c MFr Bld: 5.4 % (ref 4.8–5.6)

## 2021-07-21 ENCOUNTER — Other Ambulatory Visit: Payer: Self-pay

## 2021-07-22 ENCOUNTER — Other Ambulatory Visit: Payer: Self-pay

## 2021-07-31 ENCOUNTER — Telehealth: Payer: Self-pay | Admitting: Nurse Practitioner

## 2021-07-31 NOTE — Telephone Encounter (Signed)
Copied from CRM #[id number]. Topic: General - Other >> Oct 22, 2020 12:52 PM Mcneil, Ja-Kwan wrote: Reason for CRM: Pt requested to speak with Carlos. Pt requests call back. 

## 2021-07-31 NOTE — Telephone Encounter (Signed)
I return Pt call, he inform me that he has insurance right now and he does not need the financial programs

## 2021-08-18 ENCOUNTER — Encounter: Payer: Self-pay | Admitting: Nurse Practitioner

## 2021-08-21 ENCOUNTER — Telehealth: Payer: Self-pay

## 2021-08-21 NOTE — Telephone Encounter (Signed)
Surgical clearance faxed 08/21/2021

## 2021-09-04 ENCOUNTER — Other Ambulatory Visit: Payer: Self-pay

## 2021-09-11 ENCOUNTER — Other Ambulatory Visit: Payer: Self-pay

## 2021-10-06 ENCOUNTER — Other Ambulatory Visit: Payer: Self-pay

## 2021-10-06 ENCOUNTER — Other Ambulatory Visit: Payer: Self-pay | Admitting: Nurse Practitioner

## 2021-10-06 ENCOUNTER — Encounter: Payer: Self-pay | Admitting: Nurse Practitioner

## 2021-10-06 MED ORDER — TRIAMCINOLONE ACETONIDE 0.1 % EX CREA: 1.0000 "application " | TOPICAL_CREAM | Freq: Two times a day (BID) | CUTANEOUS | 0 refills | Status: DC

## 2021-11-03 ENCOUNTER — Other Ambulatory Visit: Payer: Self-pay

## 2021-11-24 ENCOUNTER — Encounter: Payer: Self-pay | Admitting: Nurse Practitioner

## 2021-11-25 ENCOUNTER — Other Ambulatory Visit: Payer: Self-pay | Admitting: Nurse Practitioner

## 2021-11-25 ENCOUNTER — Other Ambulatory Visit: Payer: Self-pay

## 2021-11-25 DIAGNOSIS — F1010 Alcohol abuse, uncomplicated: Secondary | ICD-10-CM

## 2021-11-25 DIAGNOSIS — M87051 Idiopathic aseptic necrosis of right femur: Secondary | ICD-10-CM

## 2021-11-25 MED ORDER — GABAPENTIN 600 MG PO TABS: 600.0000 mg | ORAL_TABLET | Freq: Three times a day (TID) | ORAL | 1 refills | Status: DC

## 2021-11-26 ENCOUNTER — Other Ambulatory Visit: Payer: Self-pay

## 2021-12-16 ENCOUNTER — Other Ambulatory Visit: Payer: Self-pay

## 2022-01-14 ENCOUNTER — Encounter: Payer: Self-pay | Admitting: Nurse Practitioner

## 2022-01-30 ENCOUNTER — Emergency Department (HOSPITAL_BASED_OUTPATIENT_CLINIC_OR_DEPARTMENT_OTHER)
Admission: EM | Admit: 2022-01-30 | Discharge: 2022-01-30 | Disposition: A | Discharge status: Home / Self Care | Payer: BC Managed Care – PPO | Attending: Emergency Medicine | Admitting: Emergency Medicine

## 2022-01-30 ENCOUNTER — Other Ambulatory Visit: Payer: Self-pay

## 2022-01-30 ENCOUNTER — Encounter (HOSPITAL_BASED_OUTPATIENT_CLINIC_OR_DEPARTMENT_OTHER): Payer: Self-pay

## 2022-01-30 DIAGNOSIS — Z91010 Allergy to peanuts: Secondary | ICD-10-CM | POA: Insufficient documentation

## 2022-01-30 DIAGNOSIS — R079 Chest pain, unspecified: Secondary | ICD-10-CM | POA: Diagnosis not present

## 2022-01-30 DIAGNOSIS — F1090 Alcohol use, unspecified, uncomplicated: Secondary | ICD-10-CM | POA: Diagnosis not present

## 2022-01-30 DIAGNOSIS — R42 Dizziness and giddiness: Secondary | ICD-10-CM | POA: Diagnosis present

## 2022-01-30 DIAGNOSIS — I10 Essential (primary) hypertension: Secondary | ICD-10-CM | POA: Insufficient documentation

## 2022-01-30 DIAGNOSIS — Z79899 Other long term (current) drug therapy: Secondary | ICD-10-CM | POA: Diagnosis not present

## 2022-01-30 MED ORDER — CHLORDIAZEPOXIDE HCL 25 MG PO CAPS: ORAL_CAPSULE | ORAL | 0 refills | Status: DC

## 2022-01-30 MED ORDER — ONDANSETRON 4 MG PO TBDP: 4.0000 mg | ORAL_TABLET | Freq: Three times a day (TID) | ORAL | 0 refills | Status: DC | PRN

## 2022-01-30 MED ORDER — AMLODIPINE BESYLATE 10 MG PO TABS: ORAL_TABLET | Freq: Every day | ORAL | 0 refills | Status: DC

## 2022-01-30 MED ORDER — FAMOTIDINE 20 MG PO TABS
20.0000 mg | ORAL_TABLET | Freq: Once | ORAL | Status: AC
  Administered 2022-01-30: 20 mg via ORAL
  Filled 2022-01-30: qty 1

## 2022-01-30 MED ORDER — CHLORDIAZEPOXIDE HCL 25 MG PO CAPS
50.0000 mg | ORAL_CAPSULE | Freq: Once | ORAL | Status: AC
  Administered 2022-01-30: 50 mg via ORAL
  Filled 2022-01-30: qty 2

## 2022-01-30 MED ORDER — FAMOTIDINE 20 MG PO TABS: 20.0000 mg | ORAL_TABLET | Freq: Every day | ORAL | 0 refills | Status: DC

## 2022-01-30 NOTE — ED Triage Notes (Addendum)
Pt presents POV acute onset of feeling light headed and dizzy starting last night after starting two new medications. Pt recently started 0.1 mg Clonidine and 50 mg Naltrexone yesterday, symptoms started shortly after. Pt attempted to "sleep it off" woke today with nausea, loss of appetite and mid-sternum chest pain.   Pt reports he was started on these medications to help with detox from alcohol. Pt reports usually drinking 12 beer, miller lite's and truly's. Pt reports he began cutting back on his beer yesterday from 12 beers to 5-6 beers, started this new med, drank 2 beers today approx 1330.   Pt has an appointment with a psychiatrist at the end of the month.

## 2022-01-30 NOTE — ED Provider Notes (Signed)
[facility] EMERGENCY DEPT Provider Note   CSN: [id number] Arrival date & time: 01/30/22  1357     History  Chief Complaint  Patient presents with   Emesis   Chest Pain   Dizziness    Billy Johnson is a 31 y.o. male.   Emesis Chest Pain Associated symptoms: dizziness and vomiting   Dizziness Associated symptoms: chest pain and vomiting    Patient is a 31 year old male with past medical history significant for obesity, HTN, alcohol use disorder  Patient presented emergency room today with complaints of some lightheadedness and dizziness after taking a dose of clonidine yesterday.  He states that he is taking this because he was recommended this medicine for when he is suffering from withdrawal symptoms from his alcohol use.  States that he drinks approximately 12 beers a day and is trying to taper Johnson he is now currently drinking 5 beers a day.  He states he is quit drinking in the past and use Librium taper to do so with good results.  States he was clean for a few years before returning to drinking 3 months ago.  He states his last drink was approximately 1:30 PM this afternoon he drank 2 beers.   Denies any recreational drug use.  Denies any current chest pain lightheadedness or dizziness.  States that he feels well apart from some "upset stomach "     Home Medications Prior to Admission medications   Medication Sig Start Date End Date Taking? Authorizing Provider  chlordiazePOXIDE (LIBRIUM) 25 MG capsule 50mg  PO TID x 1D, then 25-50mg  PO BID X 1D, then 25-50mg  PO QD X 1D 01/30/22  Yes Traniya Prichett S, PA  famotidine (PEPCID) 20 MG tablet Take 1 tablet (20 mg total) by mouth daily. 01/30/22  Yes Leeroy Lovings S, PA  ondansetron (ZOFRAN-ODT) 4 MG disintegrating tablet Take 1 tablet (4 mg total) by mouth every 8 (eight) hours as needed for up to 6 doses for nausea or vomiting. 01/30/22  Yes Jahson Emanuele S, PA  amLODipine (NORVASC) 10 MG tablet TAKE 1  TABLET (10 MG TOTAL) BY MOUTH DAILY. 01/30/22 01/30/23  02/01/23, PA  gabapentin (NEURONTIN) 600 MG tablet Take 1 tablet (600 mg total) by mouth 3 (three) times daily. 11/25/21   01/25/22, NP  triamcinolone cream (KENALOG) 0.1 % Apply 1 application. topically 2 (two) times daily. 10/06/21   10/08/21, NP  varenicline (CHANTIX PAK) 0.5 MG X 11 & 1 MG X 42 tablet Take one 0.5 mg tablet by mouth once daily for 3 days, then increase to one 0.5 mg tablet twice daily for 4 days, then increase to one 1 mg tablet twice daily. 07/18/21   07/20/21, MD      Allergies    Peanuts [peanut oil]    Review of Systems   Review of Systems  Cardiovascular:  Positive for chest pain.  Gastrointestinal:  Positive for vomiting.  Neurological:  Positive for dizziness.    Physical Exam Updated Vital Signs BP (!) 139/98 (BP Location: Right Arm)   Pulse 84   Temp 98.1 F (36.7 C) (Oral)   Resp 13   SpO2 97%  Physical Exam Vitals and nursing note reviewed.  Constitutional:      General: He is not in acute distress. HENT:     Head: Normocephalic and atraumatic.     Nose: Nose normal.     Mouth/Throat:     Mouth: Mucous membranes are  moist.  Eyes:     General: No scleral icterus. Cardiovascular:     Rate and Rhythm: Normal rate and regular rhythm.     Pulses: Normal pulses.     Heart sounds: Normal heart sounds.  Pulmonary:     Effort: Pulmonary effort is normal. No respiratory distress.     Breath sounds: No wheezing.  Abdominal:     Palpations: Abdomen is soft.     Tenderness: There is no abdominal tenderness. There is no guarding or rebound.     Comments: Abdomen is soft nontender.  No guarding or rebound.  Musculoskeletal:     Cervical back: Normal range of motion.     Right lower leg: No edema.     Left lower leg: No edema.  Skin:    General: Skin is warm and dry.     Capillary Refill: Capillary refill takes less than 2 seconds.  Neurological:     Mental  Status: He is alert. Mental status is at baseline.  Psychiatric:        Mood and Affect: Mood normal.        Behavior: Behavior normal.     ED Results / Procedures / Treatments   Labs (all labs ordered are listed, but only abnormal results are displayed) Labs Reviewed - No data to display  EKG EKG Interpretation  Date/Time:  Friday January 30 2022 14:03:16 EDT Ventricular Rate:  82 PR Interval:  161 QRS Duration: 85 QT Interval:  343 QTC Calculation: 401 R Axis:   62 Text Interpretation: Sinus rhythm Confirmed by Lorre Nick ([id number]) on 01/30/2022 2:52:54 PM  Radiology No results found.  Procedures Procedures    Medications Ordered in ED Medications  chlordiazePOXIDE (LIBRIUM) capsule 50 mg (has no administration in time range)  famotidine (PEPCID) tablet 20 mg (has no administration in time range)    ED Course/ Medical Decision Making/ A&P                           Medical Decision Making Risk Prescription drug management.   Patient is a 31 year old male with past medical history significant for obesity, HTN, alcohol use disorder  Patient presented emergency room today with complaints of some lightheadedness and dizziness after taking a dose of clonidine yesterday.  He states that he is taking this because he was recommended this medicine for when he is suffering from withdrawal symptoms from his alcohol use.  States that he drinks approximately 12 beers a day and is trying to taper Johnson he is now currently drinking 5 beers a day.  He states he is quit drinking in the past and use Librium taper to do so with good results.  States he was clean for a few years before returning to drinking 3 months ago.  He states his last drink was approximately 1:30 PM this afternoon he drank 2 beers.   Denies any recreational drug use.  Denies any current chest pain lightheadedness or dizziness.  States that he feels well apart from some "upset stomach "    Physical exam  reassuring.  No abdominal tenderness.  No guarding or rebound.  EKG obtained which is normal sinus rhythm no evidence of ischemia.  No acute abnormalities.   I had a lengthy transitioning conversation with patient and his partner who is at bedside I offered additional work-up considering the abdominal discomfort he is having versus empiric treatment with Librium and Pepcid and close follow-up with primary  care.  They would prefer to do the latter.  I recommend very strict return precautions and monitoring of her symptoms.  We will discharge patient with Zofran, Librium, refill amlodipine and give short course of Zofran for nausea as needed.   Final Clinical Impression(s) / ED Diagnoses Final diagnoses:  Alcohol use disorder    Rx / DC Orders ED Discharge Orders          Ordered    chlordiazePOXIDE (LIBRIUM) 25 MG capsule        01/30/22 1452    amLODipine (NORVASC) 10 MG tablet  Daily        01/30/22 1452    famotidine (PEPCID) 20 MG tablet  Daily        01/30/22 1455    ondansetron (ZOFRAN-ODT) 4 MG disintegrating tablet  Every 8 hours PRN        01/30/22 1507              Gailen Shelter, [place] 01/30/22 1507    Lorre Nick, MD 02/02/22 1438

## 2022-01-30 NOTE — Discharge Instructions (Addendum)
Please take the Librium as prescribed.  Please follow-up with your primary care provider.  Return to the emergency room for any new or concerning symptoms as we discussed.  I have also written you a prescription for Pepcid which I would like you to take daily as prescribed and I have refilled your amlodipine

## 2022-02-08 ENCOUNTER — Other Ambulatory Visit: Payer: Self-pay | Admitting: Nurse Practitioner

## 2022-02-08 DIAGNOSIS — M87051 Idiopathic aseptic necrosis of right femur: Secondary | ICD-10-CM

## 2022-02-08 DIAGNOSIS — F1010 Alcohol abuse, uncomplicated: Secondary | ICD-10-CM

## 2022-02-08 MED ORDER — GABAPENTIN 600 MG PO TABS: 600.0000 mg | ORAL_TABLET | Freq: Three times a day (TID) | ORAL | 2 refills | Status: DC

## 2022-04-05 ENCOUNTER — Encounter: Payer: Self-pay | Admitting: Nurse Practitioner

## 2022-04-06 ENCOUNTER — Other Ambulatory Visit: Payer: Self-pay | Admitting: Nurse Practitioner

## 2022-04-06 DIAGNOSIS — I10 Essential (primary) hypertension: Secondary | ICD-10-CM

## 2022-04-06 MED ORDER — AMLODIPINE BESYLATE 10 MG PO TABS
10.0000 mg | ORAL_TABLET | Freq: Every day | ORAL | 0 refills | Status: DC
Start: 2022-04-06 — End: 2022-06-01

## 2022-06-01 ENCOUNTER — Ambulatory Visit (INDEPENDENT_AMBULATORY_CARE_PROVIDER_SITE_OTHER): Payer: BC Managed Care – PPO | Admitting: Nurse Practitioner

## 2022-06-01 ENCOUNTER — Other Ambulatory Visit: Payer: Self-pay | Admitting: Nurse Practitioner

## 2022-06-01 ENCOUNTER — Encounter: Payer: Self-pay | Admitting: Nurse Practitioner

## 2022-06-01 VITALS — BP 136/80 | HR 94 | Temp 98.6°F | Ht 69.0 in | Wt 214.2 lb

## 2022-06-01 DIAGNOSIS — Z23 Encounter for immunization: Secondary | ICD-10-CM | POA: Diagnosis not present

## 2022-06-01 DIAGNOSIS — F320 Major depressive disorder, single episode, mild: Secondary | ICD-10-CM

## 2022-06-01 DIAGNOSIS — F32A Depression, unspecified: Secondary | ICD-10-CM | POA: Insufficient documentation

## 2022-06-01 DIAGNOSIS — I10 Essential (primary) hypertension: Secondary | ICD-10-CM

## 2022-06-01 DIAGNOSIS — F1091 Alcohol use, unspecified, in remission: Secondary | ICD-10-CM | POA: Diagnosis not present

## 2022-06-01 DIAGNOSIS — Z7689 Persons encountering health services in other specified circumstances: Secondary | ICD-10-CM | POA: Insufficient documentation

## 2022-06-01 DIAGNOSIS — E669 Obesity, unspecified: Secondary | ICD-10-CM

## 2022-06-01 DIAGNOSIS — Z0000 Encounter for general adult medical examination without abnormal findings: Secondary | ICD-10-CM | POA: Insufficient documentation

## 2022-06-01 MED ORDER — AMLODIPINE BESYLATE 10 MG PO TABS
10.0000 mg | ORAL_TABLET | Freq: Every day | ORAL | 0 refills | Status: AC
Start: 2022-06-01 — End: 2022-08-30

## 2022-06-01 NOTE — Assessment & Plan Note (Signed)
Wt Readings from Last 3 Encounters:  06/01/22 214 lb 3.2 oz (97.2 kg)  07/18/21 209 lb 12.8 oz (95.2 kg)  11/21/20 205 lb (93 kg)  Patient counseled on low-carb modified diet and encouraged to engage in regular moderate exercises at least 150 minutes weekly

## 2022-06-01 NOTE — Assessment & Plan Note (Signed)
    06/01/2022   10:42 AM 07/18/2021    9:14 AM 06/07/2020   10:31 AM 04/30/2020    8:50 AM  Depression screen PHQ 2/9  Decreased Interest 2 1 0 0  Down, Depressed, Hopeless 1 0 0 0  PHQ - 2 Score 3 1 0 0  Altered sleeping 0 1 2   Tired, decreased energy 1 1 1    Change in appetite 3 0 2   Feeling bad or failure about yourself  1 0 0   Trouble concentrating 0 0 0   Moving slowly or fidgety/restless 0 0 0   Suicidal thoughts 0 0 0   PHQ-9 Score 8 3 5    Difficult doing work/chores Somewhat difficult      Denies SI, HI Billy Johnson has stopped taking wellbutrin after taking med for 3 weeks  Patient encouraged to restart wellbutrin , I explained that it might take up to 6 weeks before Billy Johnson starts feeling better on antidepressants

## 2022-06-01 NOTE — Assessment & Plan Note (Signed)
BP Readings from Last 3 Encounters:  06/01/22 136/80  01/30/22 (!) 136/92  07/18/21 131/78  Chronic medical condition well-controlled Currently on amlodipine 10 mg daily needs refill Amlodipine 10 mg daily refilled DASH diet advised engage in regular daily exercises at least 250 minutes weekly Check CMP today Follow-up with PCP in 6 months

## 2022-06-01 NOTE — Assessment & Plan Note (Signed)
Currently in alcohol use disorder rehab, followed by therapist Takes gabapentin 600 mg 3 times daily Patient encouraged to maintain close follow-up with his therapist. Patient counseled on alcohol cessation.

## 2022-06-01 NOTE — Assessment & Plan Note (Signed)
Annual exam as documented.  Counseling done include healthy lifestyle involving committing to 150 minutes of exercise per week, heart healthy diet, and attaining healthy weight. The importance of adequate sleep also discussed.  Regular use of seat belt and home safety were also discussed . Changes in health habits are decided on by patient with goals and time frames set for achieving them. Immunization screening  needs are specifically addressed at this visit.   Patient declined flu vaccine in the office today.  Tdap vaccine given

## 2022-06-01 NOTE — Patient Instructions (Signed)

## 2022-06-01 NOTE — Assessment & Plan Note (Signed)
Patient educated on CDC recommendation for the TDAP  vaccine. Verbal consent was obtained from the patient, vaccine administered by nurse, no sign of adverse reactions noted at this time. Patient education on arm soreness and use of tylenol  for this patient  was discussed. Patient educated on the signs and symptoms of adverse effect and advise to contact the office if they occur. V

## 2022-06-01 NOTE — Progress Notes (Signed)
Established Patient Office Visit  Subjective:  Patient ID: Billy Johnson, male    DOB: 30-Jun-1991  Age: 31 y.o. MRN: [id number]  CC:  Chief Complaint  Patient presents with   Consult    Pt is requesting labs cbc and cmet    HPI Billy Johnson is a 31 y.o. male with past medical history of hypertension, alcohol use disorder, obesity presents for annual physical examination.  Patient on Zelda NP.   Hypertension.  Currently on amlodipine 10 mg daily, needs a refill for the medication.  He denies dizziness, edema, syncope.   Due for Tdap vaccine Tdap vaccine given in the office today.  Patient declined flu vaccine in the office today.  Goes to [facility] for [facility] use [facility], he responded well to Librium in the past was able to stay off alcohol for a year after treatment with Librium.  He is considering taking Librium again.  Currently taking gabapentin.  Has upcoming appointment with therapist in 2 days.  Patient denies abdominal pain, vomiting, unusual bleeding.  Sometimes has nausea.  He denies SI, HI.  Depression. Has Wellburin ordered but has not been taking med, med was ordered about a month ago. , last use was about a week ago after taking med for 3 weeks , feels like it was not working, denies SI, HI.   Past Medical History:  Diagnosis Date   Asthma    Avascular necrosis ([place])    Hypertension     Past Surgical History:  Procedure Laterality Date   NO PAST SURGERIES     TOTAL HIP ARTHROPLASTY      Family History  Problem Relation Age of Onset   Breast cancer Mother    Diabetes Neg Hx    Prostate cancer Neg Hx    Colon cancer Neg Hx     Social History   Socioeconomic History   Marital status: Single    Spouse name: Not on file   Number of children: Not on file   Years of education: Not on file   Highest education level: Not on file  Occupational History   Not on file  Tobacco Use   Smoking status: Former   Smokeless tobacco: Current  Vaping  Use   Vaping Use: Every day  Substance and Sexual Activity   Alcohol use: Not Currently    Comment: stopped 3 weeks ago   Drug use: No   Sexual activity: Yes  Other Topics Concern   Not on file  Social History Narrative   Lives with his partner    Social Determinants of Health   Financial Resource Strain: Not on file  Food Insecurity: Not on file  Transportation Needs: Not on file  Physical Activity: Not on file  Stress: Not on file  Social Connections: Not on file  Intimate Partner Violence: Not on file    Outpatient Medications Prior to Visit  Medication Sig Dispense Refill   gabapentin (NEURONTIN) 600 MG tablet Take 1 tablet (600 mg total) by mouth 3 (three) times daily. 90 tablet 2   chlordiazePOXIDE (LIBRIUM) 25 MG capsule 34m PO TID x 1D, then 25-520mPO BID X 1D, then 25-5041mO QD X 1D (Patient not taking: Reported on 06/01/2022) 12 capsule 0   famotidine (PEPCID) 20 MG tablet Take 1 tablet (20 mg total) by mouth daily. (Patient not taking: Reported on 06/01/2022) 30 tablet 0   naltrexone (DEPADE) 50 MG tablet Take 50 mg by mouth daily. (Patient not taking:  Reported on 06/01/2022)     ondansetron (ZOFRAN-ODT) 4 MG disintegrating tablet Take 1 tablet (4 mg total) by mouth every 8 (eight) hours as needed for up to 6 doses for nausea or vomiting. (Patient not taking: Reported on 06/01/2022) 6 tablet 0   triamcinolone cream (KENALOG) 0.1 % Apply 1 application. topically 2 (two) times daily. (Patient not taking: Reported on 06/01/2022) 60 g 0   varenicline (CHANTIX PAK) 0.5 MG X 11 & 1 MG X 42 tablet Take one 0.5 mg tablet by mouth once daily for 3 days, then increase to one 0.5 mg tablet twice daily for 4 days, then increase to one 1 mg tablet twice daily. (Patient not taking: Reported on 06/01/2022) 53 tablet 0   amLODipine (NORVASC) 10 MG tablet Take 1 tablet (10 mg total) by mouth daily. (Patient not taking: Reported on 06/01/2022) 60 tablet 0   No facility-administered  medications prior to visit.    Allergies  Allergen Reactions   Peanuts [Peanut Oil]     ROS Review of Systems  Constitutional: Negative.  Negative for activity change, appetite change and chills.  HENT: Negative.  Negative for congestion, dental problem and drooling.   Eyes: Negative.   Respiratory: Negative.  Negative for apnea, cough, choking, chest tightness, shortness of breath and wheezing.   Cardiovascular: Negative.  Negative for chest pain, palpitations and leg swelling.  Endocrine: Negative.   Genitourinary: Negative.  Negative for difficulty urinating, dysuria and flank pain.  Musculoskeletal: Negative.   Skin: Negative.   Allergic/Immunologic: Negative.   Neurological: Negative.  Negative for dizziness, facial asymmetry and headaches.  Hematological: Negative.   Psychiatric/Behavioral: Negative.  Negative for agitation, behavioral problems, confusion and decreased concentration.       Objective:    Physical Exam Constitutional:      General: He is not in acute distress.    Appearance: He is obese. He is not ill-appearing, toxic-appearing or diaphoretic.  HENT:     Head: Normocephalic and atraumatic.     Right Ear: Tympanic membrane, ear canal and external ear normal. There is no impacted cerumen.     Left Ear: Tympanic membrane, ear canal and external ear normal. There is no impacted cerumen.     Nose: Nose normal. No congestion or rhinorrhea.     Mouth/Throat:     Mouth: Mucous membranes are moist.     Pharynx: Oropharynx is clear. No oropharyngeal exudate or posterior oropharyngeal erythema.  Eyes:     General: No scleral icterus.       Right eye: No discharge.        Left eye: No discharge.     Extraocular Movements: Extraocular movements intact.     Conjunctiva/sclera: Conjunctivae normal.     Pupils: Pupils are equal, round, and reactive to light.  Neck:     Vascular: No carotid bruit.  Cardiovascular:     Rate and Rhythm: Normal rate and regular  rhythm.     Pulses: Normal pulses.     Heart sounds: Normal heart sounds. No murmur heard.    No friction rub. No gallop.  Pulmonary:     Effort: Pulmonary effort is normal. No respiratory distress.     Breath sounds: Normal breath sounds. No stridor. No wheezing, rhonchi or rales.  Chest:     Chest wall: No tenderness.  Abdominal:     General: There is no distension.     Palpations: Abdomen is soft. There is no mass.     Tenderness:  There is no abdominal tenderness. There is no right CVA tenderness, left CVA tenderness, guarding or rebound.     Hernia: No hernia is present.  Musculoskeletal:        General: No swelling, tenderness, deformity or signs of injury.     Cervical back: Normal range of motion and neck supple. No rigidity or tenderness.     Right lower leg: No edema.     Left lower leg: No edema.  Lymphadenopathy:     Cervical: No cervical adenopathy.  Skin:    General: Skin is warm and dry.     Capillary Refill: Capillary refill takes less than 2 seconds.     Coloration: Skin is not jaundiced or pale.     Findings: No bruising, erythema, lesion or rash.  Neurological:     Mental Status: He is alert and oriented to person, place, and time.     Cranial Nerves: No cranial nerve deficit.     Sensory: No sensory deficit.     Motor: No weakness.     Coordination: Coordination normal.     Gait: Gait normal.     Deep Tendon Reflexes: Reflexes normal.  Psychiatric:        Mood and Affect: Mood normal.        Behavior: Behavior normal.        Thought Content: Thought content normal.        Judgment: Judgment normal.     BP 136/80   Pulse 94   Temp 98.6 F (37 C)   Ht _0  (1.753 m)   Wt 214 lb 3.2 oz (97.2 kg)   SpO2 96%   BMI 31.63 kg/m  Wt Readings from Last 3 Encounters:  06/01/22 214 lb 3.2 oz (97.2 kg)  07/18/21 209 lb 12.8 oz (95.2 kg)  11/21/20 205 lb (93 kg)    No results found for: "TSH" Lab Results  Component Value Date   WBC 11.6 (H) 12/07/2020    HGB 15.1 12/07/2020   HCT 43.0 12/07/2020   MCV 93.3 12/07/2020   PLT 335 12/07/2020   Lab Results  Component Value Date   NA 139 07/18/2021   K 4.6 07/18/2021   CO2 22 07/18/2021   GLUCOSE 95 07/18/2021   BUN 13 07/18/2021   CREATININE 0.95 07/18/2021   BILITOT 0.4 07/18/2021   ALKPHOS 77 07/18/2021   AST 14 07/18/2021   ALT 15 07/18/2021   PROT 7.1 07/18/2021   ALBUMIN 4.7 07/18/2021   CALCIUM 9.5 07/18/2021   ANIONGAP 10 12/07/2020   EGFR 110 07/18/2021   Lab Results  Component Value Date   CHOL 204 (H) 04/30/2020   Lab Results  Component Value Date   HDL 48 04/30/2020   Lab Results  Component Value Date   LDLCALC 131 (H) 04/30/2020   Lab Results  Component Value Date   TRIG 138 04/30/2020   Lab Results  Component Value Date   CHOLHDL 4.3 04/30/2020   Lab Results  Component Value Date   HGBA1C 5.4 07/18/2021      Assessment & Plan:   Problem List Items Addressed This Visit       Cardiovascular and Mediastinum   Essential hypertension    BP Readings from Last 3 Encounters:  06/01/22 136/80  01/30/22 (!) 136/92  07/18/21 131/78  Chronic medical condition well-controlled Currently on amlodipine 10 mg daily needs refill Amlodipine 10 mg daily refilled DASH diet advised engage in regular daily exercises at least 250 minutes  weekly Check CMP today Follow-up with PCP in 6 months      Relevant Medications   amLODipine (NORVASC) 10 MG tablet     Other   Alcohol use disorder in remission    Currently in alcohol use disorder rehab, followed by therapist Takes gabapentin 600 mg 3 times daily Patient encouraged to maintain close follow-up with his therapist. Patient counseled on alcohol cessation.      Obesity (BMI 30.0-34.9)    Wt Readings from Last 3 Encounters:  06/01/22 214 lb 3.2 oz (97.2 kg)  07/18/21 209 lb 12.8 oz (95.2 kg)  11/21/20 205 lb (93 kg)  Patient counseled on low-carb modified diet and encouraged to engage in regular  moderate exercises at least 150 minutes weekly       Annual physical exam - Primary    Annual exam as documented.  Counseling done include healthy lifestyle involving committing to 150 minutes of exercise per week, heart healthy diet, and attaining healthy weight. The importance of adequate sleep also discussed.  Regular use of seat belt and home safety were also discussed . Changes in health habits are decided on by patient with goals and time frames set for achieving them. Immunization screening  needs are specifically addressed at this visit.   Patient declined flu vaccine in the office today.  Tdap vaccine given      Relevant Orders   CMP14+EGFR   CBC with Differential   Hemoglobin A1c   Lipid Panel   TSH   Vitamin D, 25-hydroxy   Need for Tdap vaccination    Patient educated on CDC recommendation for the TDAP  vaccine. Verbal consent was obtained from the patient, vaccine administered by nurse, no sign of adverse reactions noted at this time. Patient education on arm soreness and use of tylenol  for this patient  was discussed. Patient educated on the signs and symptoms of adverse effect and advise to contact the office if they occur. V      Relevant Orders   Tdap vaccine greater than or equal to 7yo IM   Depression       06/01/2022   10:42 AM 07/18/2021    9:14 AM 06/07/2020   10:31 AM 04/30/2020    8:50 AM  Depression screen PHQ 2/9  Decreased Interest 2 1 0 0  Down, Depressed, Hopeless 1 0 0 0  PHQ - 2 Score 3 1 0 0  Altered sleeping 0 1 2   Tired, decreased energy _0 Change in appetite 3 0 2   Feeling bad or failure about yourself  1 0 0   Trouble concentrating 0 0 0   Moving slowly or fidgety/restless 0 0 0   Suicidal thoughts 0 0 0   PHQ-9 Score _1 Difficult doing work/chores Somewhat difficult      Denies SI, HI He has stopped taking wellbutrin after taking med for 3 weeks  Patient encouraged to restart wellbutrin , I explained that it might take  up to 6 weeks before he starts feeling better on antidepressants        Meds ordered this encounter  Medications   amLODipine (NORVASC) 10 MG tablet    Sig: Take 1 tablet (10 mg total) by mouth daily.    Dispense:  90 tablet    Refill:  0    NO additional refills until office visit    Follow-up: Return in about 6 months (around 11/30/2022).    [employer]  Cato Mulligan, FNP

## 2022-06-02 LAB — CMP14+EGFR
ALT: 26 IU/L (ref 0–44)
AST: 23 IU/L (ref 0–40)
Albumin/Globulin Ratio: 1.9 (ref 1.2–2.2)
Albumin: 4.9 g/dL (ref 4.3–5.2)
Alkaline Phosphatase: 70 IU/L (ref 44–121)
BUN/Creatinine Ratio: 6 — ABNORMAL LOW (ref 9–20)
BUN: 5 mg/dL — ABNORMAL LOW (ref 6–20)
Bilirubin Total: 0.4 mg/dL (ref 0.0–1.2)
CO2: 21 mmol/L (ref 20–29)
Calcium: 9.3 mg/dL (ref 8.7–10.2)
Chloride: 99 mmol/L (ref 96–106)
Creatinine, Ser: 0.88 mg/dL (ref 0.76–1.27)
Globulin, Total: 2.6 g/dL (ref 1.5–4.5)
Glucose: 109 mg/dL — ABNORMAL HIGH (ref 70–99)
Potassium: 4.1 mmol/L (ref 3.5–5.2)
Sodium: 139 mmol/L (ref 134–144)
Total Protein: 7.5 g/dL (ref 6.0–8.5)
eGFR: 119 mL/min/{1.73_m2} (ref >59)

## 2022-06-02 LAB — LIPID PANEL
Chol/HDL Ratio: 3 ratio (ref 0.0–5.0)
Cholesterol, Total: 204 mg/dL — ABNORMAL HIGH (ref 100–199)
HDL: 67 mg/dL (ref >39)
LDL Chol Calc (NIH): 112 mg/dL — ABNORMAL HIGH (ref 0–99)
Triglycerides: 142 mg/dL (ref 0–149)
VLDL Cholesterol Cal: 25 mg/dL (ref 5–40)

## 2022-06-02 LAB — CBC WITH DIFFERENTIAL/PLATELET
Basophils Absolute: 0 10*3/uL (ref 0.0–0.2)
Basos: 0 %
EOS (ABSOLUTE): 0.1 10*3/uL (ref 0.0–0.4)
Eos: 1 %
Hematocrit: 48.4 % (ref 37.5–51.0)
Hemoglobin: 16 g/dL (ref 13.0–17.7)
Immature Grans (Abs): 0 10*3/uL (ref 0.0–0.1)
Immature Granulocytes: 0 %
Lymphocytes Absolute: 2.3 10*3/uL (ref 0.7–3.1)
Lymphs: 31 %
MCH: 29.7 pg (ref 26.6–33.0)
MCHC: 33.1 g/dL (ref 31.5–35.7)
MCV: 90 fL (ref 79–97)
Monocytes Absolute: 0.5 10*3/uL (ref 0.1–0.9)
Monocytes: 7 %
Neutrophils Absolute: 4.5 10*3/uL (ref 1.4–7.0)
Neutrophils: 61 %
Platelets: 193 10*3/uL (ref 150–450)
RBC: 5.39 x10E6/uL (ref 4.14–5.80)
RDW: 14.2 % (ref 11.6–15.4)
WBC: 7.4 10*3/uL (ref 3.4–10.8)

## 2022-06-02 LAB — TSH: TSH: 1.19 u[IU]/mL (ref 0.450–4.500)

## 2022-06-02 LAB — HEMOGLOBIN A1C
Est. average glucose Bld gHb Est-mCnc: 103 mg/dL
Hgb A1c MFr Bld: 5.2 % (ref 4.8–5.6)

## 2022-06-02 LAB — VITAMIN D 25 HYDROXY (VIT D DEFICIENCY, FRACTURES): Vit D, 25-Hydroxy: 49.5 ng/mL (ref 30.0–100.0)

## 2022-06-02 NOTE — Telephone Encounter (Signed)
Duplicate request- Rx filled 06/02/22 Requested Prescriptions  Pending Prescriptions Disp Refills   amLODipine (NORVASC) 10 MG tablet [Pharmacy Med Name: amLODIPine BESYLATE 10 MG TAB] 60 tablet 0    Sig: TAKE 1 TABLET BY MOUTH DAILY     Cardiovascular: Calcium Channel Blockers 2 Failed - 06/01/2022  6:21 AM      Failed - Last BP in normal range    BP Readings from Last 1 Encounters:  06/01/22 136/80         Failed - Valid encounter within last 6 months    Recent Outpatient Visits           10 months ago Preoperative evaluation to rule out surgical contraindication   [facility] Marcine Matar, MD   1 year ago Encounter for annual physical exam   [facility] [place], Shea Stakes, NP   2 years ago Encounter to establish care   [facility] [place], Shea Stakes, NP       Future Appointments             In 6 months P[place], Baird Kay, FNP [facility]            Passed - Last Heart Rate in normal range    Pulse Readings from Last 1 Encounters:  06/01/22 94

## 2022-06-02 NOTE — Progress Notes (Signed)
Cholesterol level is slightly elevated  but this was not a fasting labs.  Eat a healthy diet, including lots of fruits and vegetables. Avoid foods with a lot of saturated and trans fats, such as red meat, butter, fried foods and cheese . Maintain a healthy weight.  Other labs are normal

## 2022-06-18 ENCOUNTER — Other Ambulatory Visit: Payer: Self-pay | Admitting: Nurse Practitioner

## 2022-06-18 ENCOUNTER — Encounter: Payer: Self-pay | Admitting: Nurse Practitioner

## 2022-06-18 DIAGNOSIS — M87051 Idiopathic aseptic necrosis of right femur: Secondary | ICD-10-CM

## 2022-06-18 DIAGNOSIS — F1010 Alcohol abuse, uncomplicated: Secondary | ICD-10-CM

## 2022-06-18 MED ORDER — GABAPENTIN 600 MG PO TABS: 600.0000 mg | ORAL_TABLET | Freq: Three times a day (TID) | ORAL | 2 refills | Status: DC

## 2022-06-18 MED ORDER — EPINEPHRINE 0.3 MG/0.3ML IJ SOAJ: 0.3000 mg | INTRAMUSCULAR | 99 refills | Status: DC | PRN

## 2022-10-08 ENCOUNTER — Other Ambulatory Visit: Payer: Self-pay | Admitting: Nurse Practitioner

## 2022-10-08 DIAGNOSIS — F1010 Alcohol abuse, uncomplicated: Secondary | ICD-10-CM

## 2022-10-08 DIAGNOSIS — M87051 Idiopathic aseptic necrosis of right femur: Secondary | ICD-10-CM

## 2022-11-30 ENCOUNTER — Ambulatory Visit: Payer: Self-pay | Admitting: Nurse Practitioner

## 2023-04-09 ENCOUNTER — Emergency Department (HOSPITAL_BASED_OUTPATIENT_CLINIC_OR_DEPARTMENT_OTHER)
Admission: EM | Admit: 2023-04-09 | Discharge: 2023-04-09 | Disposition: A | Discharge status: Home / Self Care | Payer: Medicaid Other | Attending: Emergency Medicine | Admitting: Emergency Medicine

## 2023-04-09 ENCOUNTER — Other Ambulatory Visit: Payer: Self-pay

## 2023-04-09 DIAGNOSIS — Z91010 Allergy to peanuts: Secondary | ICD-10-CM | POA: Diagnosis not present

## 2023-04-09 DIAGNOSIS — Y908 Blood alcohol level of 240 mg/100 ml or more: Secondary | ICD-10-CM | POA: Diagnosis not present

## 2023-04-09 DIAGNOSIS — F1099 Alcohol use, unspecified with unspecified alcohol-induced disorder: Secondary | ICD-10-CM | POA: Diagnosis present

## 2023-04-09 DIAGNOSIS — F1090 Alcohol use, unspecified, uncomplicated: Secondary | ICD-10-CM

## 2023-04-09 LAB — COMPREHENSIVE METABOLIC PANEL
ALT: 54 U/L — ABNORMAL HIGH (ref 0–44)
AST: 31 U/L (ref 15–41)
Albumin: 4.2 g/dL (ref 3.5–5.0)
Alkaline Phosphatase: 63 U/L (ref 38–126)
Anion gap: 13 (ref 5–15)
BUN: 8 mg/dL (ref 6–20)
CO2: 25 mmol/L (ref 22–32)
Calcium: 8.6 mg/dL — ABNORMAL LOW (ref 8.9–10.3)
Chloride: 100 mmol/L (ref 98–111)
Creatinine, Ser: 0.95 mg/dL (ref 0.61–1.24)
GFR, Estimated: >60 mL/min (ref >60)
Glucose, Bld: 152 mg/dL — ABNORMAL HIGH (ref 70–99)
Potassium: 3.5 mmol/L (ref 3.5–5.1)
Sodium: 138 mmol/L (ref 135–145)
Total Bilirubin: 0.6 mg/dL (ref 0.3–1.2)
Total Protein: 7.2 g/dL (ref 6.5–8.1)

## 2023-04-09 LAB — CBC
HCT: 43.5 % (ref 39.0–52.0)
Hemoglobin: 16.1 g/dL (ref 13.0–17.0)
MCH: 30.8 pg (ref 26.0–34.0)
MCHC: 37 g/dL — ABNORMAL HIGH (ref 30.0–36.0)
MCV: 83.3 fL (ref 80.0–100.0)
Platelets: 200 10*3/uL (ref 150–400)
RBC: 5.22 MIL/uL (ref 4.22–5.81)
RDW: 12.5 % (ref 11.5–15.5)
WBC: 7.3 10*3/uL (ref 4.0–10.5)
nRBC: 0 % (ref 0.0–0.2)

## 2023-04-09 LAB — ETHANOL: Alcohol, Ethyl (B): 300 mg/dL — ABNORMAL HIGH (ref <10)

## 2023-04-09 LAB — RAPID URINE DRUG SCREEN, HOSP PERFORMED
Amphetamines: NOT DETECTED
Barbiturates: NOT DETECTED
Benzodiazepines: NOT DETECTED
Cocaine: NOT DETECTED
Opiates: NOT DETECTED
Tetrahydrocannabinol: NOT DETECTED

## 2023-04-09 LAB — MAGNESIUM: Magnesium: 2 mg/dL (ref 1.7–2.4)

## 2023-04-09 MED ORDER — GABAPENTIN 300 MG PO CAPS: 300.0000 mg | ORAL_CAPSULE | Freq: Three times a day (TID) | ORAL | 0 refills | Status: DC | PRN

## 2023-04-09 MED ORDER — CHLORDIAZEPOXIDE HCL 25 MG PO CAPS: ORAL_CAPSULE | ORAL | 0 refills | Status: DC

## 2023-04-09 NOTE — ED Notes (Signed)
Discharge paperwork given and verbally understood.

## 2023-04-09 NOTE — ED Triage Notes (Addendum)
Pt reports h/o alcoholism, sober 1 year until last Friday.  For past week, pt has been drinking over 1 case of Truly spiked seltzers daily, last drink consumed about 2-3 hours ago.  Pt reports drinking 3-4 cans of Truly since awaking around 530am today.  Pt anxious about detoxing from alcohol, reports some shaking at home.  No h/o of seizures or hospitalization required for prior ETOH withdrawal.  NAD in triage.

## 2023-04-09 NOTE — ED Provider Notes (Signed)
[place] EMERGENCY DEPARTMENT AT [facility] Provider Note   CSN: [id number] Arrival date & time: 04/09/23  1219     History  Chief Complaint  Patient presents with   Alcohol Problem    Billy Johnson is a 32 y.o. male presenting to the ED in the company of his father with concern for alcohol use disorder and withdrawal.  The patient reports he has been drinking nearly 1 case of trulia hard seltzers daily.  His last drink was an hour prior to arrival in the ED.  His father reports the patient has had waxing waning courses with alcohol use disorder, as well as hospitalization for this in detox.  He had been in remission for a fairly long period of time and then went to college and unfortunately began drinking again.  They have an upcoming appointment with his counselor and PCP on Tuesday, but are not able to get into any type of treatment plan this weekend and are out of medications.  Specifically the patient had been prescribed gabapentin 600 mg 3 times daily for alcohol withdrawal disorder and has run out of these medications prior to relapsing.  HPI     Home Medications Prior to Admission medications   Medication Sig Start Date End Date Taking? Authorizing Provider  chlordiazePOXIDE (LIBRIUM) 25 MG capsule 50mg  PO TID x 1D, then 25-50mg  PO BID X 1D, then 25-50mg  PO QD X 1D 04/09/23  Yes Graviela Nodal, Kermit Balo, MD  gabapentin (NEURONTIN) 300 MG capsule Take 1 capsule (300 mg total) by mouth 3 (three) times daily as needed. 04/09/23 05/09/23 Yes Tymothy Cass, Kermit Balo, MD  amLODipine (NORVASC) 10 MG tablet Take 1 tablet (10 mg total) by mouth daily. 06/01/22 08/30/22  Donell Beers, FNP  chlordiazePOXIDE (LIBRIUM) 25 MG capsule 50mg  PO TID x 1D, then 25-50mg  PO BID X 1D, then 25-50mg  PO QD X 1D Patient not taking: Reported on 06/01/2022 01/30/22   Gailen Shelter, PA  EPINEPHrine 0.3 mg/0.3 mL IJ SOAJ injection Inject 0.3 mg into the muscle as needed for anaphylaxis. 06/18/22    Claiborne Rigg, NP  famotidine (PEPCID) 20 MG tablet Take 1 tablet (20 mg total) by mouth daily. Patient not taking: Reported on 06/01/2022 01/30/22   Gailen Shelter, PA  naltrexone (DEPADE) 50 MG tablet Take 50 mg by mouth daily. Patient not taking: Reported on 06/01/2022 04/01/22   [provider]  ondansetron (ZOFRAN-ODT) 4 MG disintegrating tablet Take 1 tablet (4 mg total) by mouth every 8 (eight) hours as needed for up to 6 doses for nausea or vomiting. Patient not taking: Reported on 06/01/2022 01/30/22   Gailen Shelter, PA  triamcinolone cream (KENALOG) 0.1 % Apply 1 application. topically 2 (two) times daily. Patient not taking: Reported on 06/01/2022 10/06/21   Claiborne Rigg, NP  varenicline (CHANTIX PAK) 0.5 MG X 11 & 1 MG X 42 tablet Take one 0.5 mg tablet by mouth once daily for 3 days, then increase to one 0.5 mg tablet twice daily for 4 days, then increase to one 1 mg tablet twice daily. Patient not taking: Reported on 06/01/2022 07/18/21   Marcine Matar, MD      Allergies    Peanuts [peanut oil]    Review of Systems   Review of Systems  Physical Exam Updated Vital Signs BP 120/79 (BP Location: Right Arm)   Pulse 78   Temp 98.8 F (37.1 C) (Oral)   Resp 16   Ht 5'  9" (1.753 m)   Wt 101.7 kg   SpO2 93%   BMI 33.12 kg/m  Physical Exam Constitutional:      General: He is not in acute distress. HENT:     Head: Normocephalic and atraumatic.  Eyes:     Conjunctiva/sclera: Conjunctivae normal.     Pupils: Pupils are equal, round, and reactive to light.  Cardiovascular:     Rate and Rhythm: Normal rate and regular rhythm.  Pulmonary:     Effort: Pulmonary effort is normal. No respiratory distress.  Abdominal:     General: There is no distension.     Tenderness: There is no abdominal tenderness.  Skin:    General: Skin is warm and dry.  Neurological:     General: No focal deficit present.     Mental Status: He is alert and oriented to person,  place, and time. Mental status is at baseline.  Psychiatric:        Mood and Affect: Mood normal.        Behavior: Behavior normal.     ED Results / Procedures / Treatments   Labs (all labs ordered are listed, but only abnormal results are displayed) Labs Reviewed  COMPREHENSIVE METABOLIC PANEL - Abnormal; Notable for the following components:      Result Value   Glucose, Bld 152 (*)    Calcium 8.6 (*)    ALT 54 (*)    All other components within normal limits  ETHANOL - Abnormal; Notable for the following components:   Alcohol, Ethyl (B) 300 (*)    All other components within normal limits  CBC - Abnormal; Notable for the following components:   MCHC 37.0 (*)    All other components within normal limits  RAPID URINE DRUG SCREEN, HOSP PERFORMED  MAGNESIUM    EKG None  Radiology No results found.  Procedures Procedures    Medications Ordered in ED Medications - No data to display  ED Course/ Medical Decision Making/ A&P                                 Medical Decision Making Amount and/or Complexity of Data Reviewed Labs: ordered.  Risk Prescription drug management.   Patient is here with concern for alcohol withdrawal and alcohol use disorder.  Supplemental history provided by the patient's father.  I personally viewed interpreted patient's labs today.  These are notable for an elevated ethanol level.  No significant electrolyte derangement or transaminitis.  The patient is otherwise stable on exam and does not demonstrate evidence of acute withdrawal.  No reported history of seizures.  I do not believe he is requiring hospitalization at this time.  However I am concerned that some of his withdrawal symptoms were impetus to resume drinking may be related to withdrawal symptoms from gabapentin, which was 600 mg at a high dose for at least 3 months prescription.  We discussed the phenomenon of gabapentin withdrawal.  I think it would be reasonable to restart him on  a lower dose, 300 mg either twice daily or 3 times daily, and then advised this will need to be further tapered down by his PCP.  They have an appointment on Tuesday.  I will also prescribe him Librium for withdrawal symptoms.  His father will be taking him home and helping to take care of him over the weekend.  The patient appears to be earnest to quit drinking alcohol,  and he seems to appreciate the gravity of the situation and the disorder.        Final Clinical Impression(s) / ED Diagnoses Final diagnoses:  Alcohol use disorder    Rx / DC Orders ED Discharge Orders          Ordered    chlordiazePOXIDE (LIBRIUM) 25 MG capsule        04/09/23 1354    gabapentin (NEURONTIN) 300 MG capsule  3 times daily PRN        04/09/23 1354              Terald Sleeper, MD 04/09/23 1358

## 2023-04-21 ENCOUNTER — Ambulatory Visit: Payer: Self-pay | Admitting: Physician Assistant

## 2023-04-22 ENCOUNTER — Encounter: Payer: Self-pay | Admitting: Physician Assistant

## 2023-04-22 ENCOUNTER — Telehealth: Payer: Self-pay | Admitting: Physician Assistant

## 2023-04-22 ENCOUNTER — Other Ambulatory Visit (HOSPITAL_BASED_OUTPATIENT_CLINIC_OR_DEPARTMENT_OTHER): Payer: Self-pay

## 2023-04-22 ENCOUNTER — Ambulatory Visit (INDEPENDENT_AMBULATORY_CARE_PROVIDER_SITE_OTHER): Payer: Medicaid Other | Admitting: Physician Assistant

## 2023-04-22 VITALS — BP 132/84 | HR 95 | Temp 98.1°F | Resp 16 | Ht 69.0 in | Wt 224.1 lb

## 2023-04-22 DIAGNOSIS — I10 Essential (primary) hypertension: Secondary | ICD-10-CM | POA: Diagnosis not present

## 2023-04-22 DIAGNOSIS — R41840 Attention and concentration deficit: Secondary | ICD-10-CM | POA: Diagnosis not present

## 2023-04-22 DIAGNOSIS — R739 Hyperglycemia, unspecified: Secondary | ICD-10-CM | POA: Diagnosis not present

## 2023-04-22 DIAGNOSIS — F1021 Alcohol dependence, in remission: Secondary | ICD-10-CM | POA: Diagnosis not present

## 2023-04-22 MED ORDER — GABAPENTIN 300 MG PO CAPS: 300.0000 mg | ORAL_CAPSULE | Freq: Three times a day (TID) | ORAL | 0 refills | Status: DC | PRN

## 2023-04-22 MED ORDER — NALTREXONE HCL 50 MG PO TABS
50.0000 mg | ORAL_TABLET | Freq: Every day | ORAL | 0 refills | Status: DC
  Filled 2023-04-22: qty 90, 90d supply, fill #0

## 2023-04-22 MED ORDER — NALTREXONE HCL 50 MG PO TABS
50.0000 mg | ORAL_TABLET | Freq: Every day | ORAL | 0 refills | Status: DC
Start: 2023-04-22 — End: 2023-04-22

## 2023-04-22 NOTE — Progress Notes (Signed)
New patient visit   Patient: Billy Johnson   DOB: [birth year]   31 y.o. Male  MRN: [id number] Visit Date: 04/22/2023  Today's healthcare provider: Alfredia Ferguson, PA-C   Cc. New patient, etoh abuse  Subjective    Billy Johnson is a 32 y.o. male who presents today as a new patient to establish care.  HPI   Pt was seen in the ED 04/09/23 for alcohol use, concern for withdrawal. Reports of drinking one whole case of hard seltzers daily.  History of hospitalization/detox.   Pt has taken gabapentin historically for withdrawals.   Pt was prescribed gabapentin 300 mg TID and chlordiazepoxide taper.  Discussed the use of AI scribe software for clinical note transcription with the patient, who gave verbal consent to proceed.  History of Present Illness   Billy Johnson, a patient with a history of alcohol abuse and ADHD, presents with a recent relapse in his alcohol use. He reports that he has been struggling with alcohol abuse for several years, with periods of sobriety lasting up to a year. His partner who he is with today reports that Billy Johnson's relapses often occur during periods of increased stress. Billy Johnson recently returned to college and started a new job, which he found stressful and unfulfilling, leading to his recent relapse.  Billy Johnson has previously undergone detox and participated in a six-month outpatient program, which he found helpful. He is considering returning to [employer] for additional support. He has not had a sponsor in the past but is open to the idea.  In addition to his alcohol abuse, there are thoughts Billy Johnson may have a history of ADHD, which his partner believes may be contributing to his alcohol use. Billy Johnson reports difficulty focusing and increased irritability, which he attempts to manage through distraction or alcohol use. He is not currently on any ADHD medication.  Billy Johnson is also taking gabapentin for residual nerve pain from a previous hip surgery along w/ help with  etoh abuse.  Billy Johnson is currently uninsured and is seeking affordable treatment options. They do not think another IOP is possible financially.       Past Medical History:  Diagnosis Date   Asthma    Avascular necrosis (HCC)    ETOH abuse    H. pylori infection    Hypertension    Past Surgical History:  Procedure Laterality Date   TOTAL HIP ARTHROPLASTY  09/2020   Family Status  Relation Name Status   Mother  Alive   Neg Hx  (Not Specified)  No partnership data on file   Family History  Problem Relation Age of Onset   Breast cancer Mother    Diabetes Neg Hx    Prostate cancer Neg Hx    Colon cancer Neg Hx    Social History   Socioeconomic History   Marital status: Media planner    Spouse name: Not on file   Number of children: Not on file   Years of education: Not on file   Highest education level: Not on file  Occupational History   Not on file  Tobacco Use   Smoking status: Former    Types: E-cigarettes   Smokeless tobacco: Never  Vaping Use   Vaping status: Every Day  Substance and Sexual Activity   Alcohol use: Not Currently    Comment: stopped 2 weeks ago   Drug use: No   Sexual activity: Yes  Other Topics Concern   Not on file  Social History Narrative  Lives with his partner    Social Determinants of Health   Financial Resource Strain: Not on file  Food Insecurity: Not on file  Transportation Needs: Not on file  Physical Activity: Not on file  Stress: Not on file  Social Connections: Not on file   Outpatient Medications Prior to Visit  Medication Sig Note   buPROPion (WELLBUTRIN XL) 150 MG 24 hr tablet Take 150 mg by mouth daily.    hydrOXYzine (VISTARIL) 50 MG capsule Take 50 mg by mouth daily as needed for anxiety.    chlordiazePOXIDE (LIBRIUM) 25 MG capsule 50mg  PO TID x 1D, then 25-50mg  PO BID X 1D, then 25-50mg  PO QD X 1D (Patient not taking: Reported on 04/22/2023)    EPINEPHrine 0.3 mg/0.3 mL IJ SOAJ injection Inject 0.3 mg into the  muscle as needed for anaphylaxis. (Patient not taking: Reported on 04/22/2023) 04/22/2023: PRN   [DISCONTINUED] amLODipine (NORVASC) 10 MG tablet Take 1 tablet (10 mg total) by mouth daily.    [DISCONTINUED] chlordiazePOXIDE (LIBRIUM) 25 MG capsule 50mg  PO TID x 1D, then 25-50mg  PO BID X 1D, then 25-50mg  PO QD X 1D (Patient not taking: Reported on 06/01/2022)    [DISCONTINUED] famotidine (PEPCID) 20 MG tablet Take 1 tablet (20 mg total) by mouth daily. (Patient not taking: Reported on 06/01/2022)    [DISCONTINUED] gabapentin (NEURONTIN) 300 MG capsule Take 1 capsule (300 mg total) by mouth 3 (three) times daily as needed. (Patient not taking: Reported on 04/22/2023)    [DISCONTINUED] naltrexone (DEPADE) 50 MG tablet Take 50 mg by mouth daily. (Patient not taking: Reported on 06/01/2022)    [DISCONTINUED] ondansetron (ZOFRAN-ODT) 4 MG disintegrating tablet Take 1 tablet (4 mg total) by mouth every 8 (eight) hours as needed for up to 6 doses for nausea or vomiting. (Patient not taking: Reported on 06/01/2022)    [DISCONTINUED] triamcinolone cream (KENALOG) 0.1 % Apply 1 application. topically 2 (two) times daily. (Patient not taking: Reported on 06/01/2022)    [DISCONTINUED] varenicline (CHANTIX PAK) 0.5 MG X 11 & 1 MG X 42 tablet Take one 0.5 mg tablet by mouth once daily for 3 days, then increase to one 0.5 mg tablet twice daily for 4 days, then increase to one 1 mg tablet twice daily. (Patient not taking: Reported on 06/01/2022)    No facility-administered medications prior to visit.   Allergies  Allergen Reactions   Peanuts [Peanut Oil] Anaphylaxis    Immunization History  Administered Date(s) Administered   DTaP 08/15/1991, 11/17/1991, 01/29/1992, 12/19/1992   Hepatitis B, PED/ADOLESCENT 01/30/1991, 08/15/1991, 04/02/1992   Hpv-Unspecified 11/03/2011, 05/19/2013   IPV 08/15/1991, 11/17/1991, 12/19/1992, 09/29/1996   MMR 10/11/1992, 07/26/1995   Meningococcal polysaccharide vaccine (MPSV4)  05/25/2006   PFIZER(Purple Top)SARS-COV-2 Vaccination 01/20/2020, 02/13/2020   Tdap 12/26/2004, 06/01/2022    Health Maintenance  Topic Date Due   HPV VACCINES (3 - Male 3-dose series) 08/11/2013   COVID-19 Vaccine (3 - 2023-24 season) 03/21/2023   INFLUENZA VACCINE  10/18/2023 (Originally 02/18/2023)   DTaP/Tdap/Td (7 - Td or Tdap) 06/01/2032   Hepatitis C Screening  Completed   HIV Screening  Completed    Patient Care Team: Billy Ferguson, PA-C as PCP - General (Physician Assistant)  Review of Systems  Constitutional:  Negative for fatigue and fever.  Respiratory:  Negative for cough and shortness of breath.   Cardiovascular:  Negative for chest pain, palpitations and leg swelling.  Neurological:  Negative for dizziness and headaches.      Objective  BP 132/84   Pulse 95   Temp 98.1 F (36.7 C) (Oral)   Resp 16   Ht 5\' 9"  (1.753 m)   Wt 224 lb 2 oz (101.7 kg)   SpO2 93%   BMI 33.10 kg/m   Physical Exam Constitutional:      General: He is awake.     Appearance: He is well-developed.  HENT:     Head: Normocephalic.  Eyes:     Conjunctiva/sclera: Conjunctivae normal.  Cardiovascular:     Rate and Rhythm: Normal rate and regular rhythm.     Heart sounds: Normal heart sounds.  Pulmonary:     Effort: Pulmonary effort is normal.     Breath sounds: Normal breath sounds.  Skin:    General: Skin is warm.  Neurological:     Mental Status: He is alert and oriented to person, place, and time.  Psychiatric:        Attention and Perception: Attention normal.        Mood and Affect: Mood normal.        Speech: Speech normal.        Behavior: Behavior is cooperative.     Depression Screen    04/22/2023    2:50 PM 06/01/2022   10:42 AM 07/18/2021    9:14 AM 06/07/2020   10:31 AM  PHQ 2/9 Scores  PHQ - 2 Score 2 3 1  0  PHQ- 9 Score 5 8 3 5    No results found for any visits on 04/22/23.  Assessment & Plan      Problem List Items Addressed This Visit        Cardiovascular and Mediastinum   Essential hypertension    Pt no longer on amlodipine BP well controlled today without medication Will montior         Other   Alcohol use disorder, severe, in early remission (HCC) - Primary    Recent relapse with self-detox at home after ED visit and librium rx. Previously completed a 53-month outpatient program. Not currently attending AA meetings or engaged with a sponsor. -Strongly encouraged re-engagement with AA meetings and obtaining a sponsor for accountability. -Consider Intensive Outpatient Program if feasible financially.  -Pt has history of tolerating Naltrexone, reports injectable works best but was unaffordable. Advised I am okay with prescribing oral temporarily given his current insurance status, but strongly advise applying for medicaid. Advised labs q 3-4 mo, reviewed lfts from ED visit.      Relevant Medications   gabapentin (NEURONTIN) 300 MG capsule   naltrexone (DEPADE) 50 MG tablet   Other Relevant Orders   Vitamin B12   Folate   Vitamin B1   Attention deficit    History of difficulty focusing and irritability. Previous diagnosis of ADHD in childhood. -Referral to [facility] for ADHD evaluation. -Pt restarted an old rx of wellbutrin for depression/adhd. Advised patient this is not something I would prescribe given his history of etoh abuse and recent relapse/binge drink. Advised of seziure possibility.       Relevant Orders   Ambulatory referral to Psychiatry   Other Visit Diagnoses     Hyperglycemia       Relevant Orders   HgB A1c        Return in about 4 months (around 08/23/2023), or if symptoms worsen or fail to improve, for chronic conditions; repeat lfts.     I, Billy Ferguson, PA-C have reviewed all documentation for this visit. The documentation on  04/22/23  for the exam, diagnosis, procedures, and orders are all accurate and complete.    Billy Ferguson, PA-C  [facility]  Primary Care at [facility] [phone] (phone) [phone] (fax)  [facility]

## 2023-04-22 NOTE — Telephone Encounter (Signed)
Sent to [facility]

## 2023-04-22 NOTE — Assessment & Plan Note (Signed)
Recent relapse with self-detox at home after ED visit and librium rx. Previously completed a 30-month outpatient program. Not currently attending AA meetings or engaged with a sponsor. -Strongly encouraged re-engagement with AA meetings and obtaining a sponsor for accountability. -Consider Intensive Outpatient Program if feasible financially.  -Pt has history of tolerating Naltrexone, reports injectable works best but was unaffordable. Advised I am okay with prescribing oral temporarily given his current insurance status, but strongly advise applying for medicaid. Advised labs q 3-4 mo, reviewed lfts from ED visit.

## 2023-04-22 NOTE — Assessment & Plan Note (Signed)
Pt no longer on amlodipine BP well controlled today without medication Will montior

## 2023-04-22 NOTE — Telephone Encounter (Signed)
Christine from [facility] pharmacy called to notify Billy Johnson that the medication, naltrexone (DEPADE) 50 MG tablet , has been on back order for 6 months. Therefore, they can not supply the pt with the medication. Please advise.

## 2023-04-22 NOTE — Assessment & Plan Note (Signed)
History of difficulty focusing and irritability. Previous diagnosis of ADHD in childhood. -Referral to [facility] for ADHD evaluation. -Pt restarted an old rx of wellbutrin for depression/adhd. Advised patient this is not something I would prescribe given his history of etoh abuse and recent relapse/binge drink. Advised of seziure possibility.

## 2023-04-22 NOTE — Telephone Encounter (Signed)
FYI

## 2023-04-23 LAB — HEMOGLOBIN A1C: Hgb A1c MFr Bld: 5.3 % (ref 4.6–6.5)

## 2023-04-23 LAB — FOLATE: Folate: 7.1 ng/mL (ref >5.9)

## 2023-04-23 LAB — VITAMIN B12: Vitamin B-12: 244 pg/mL (ref 211–911)

## 2023-04-26 LAB — VITAMIN B1: Vitamin B1 (Thiamine): 15 nmol/L (ref 8–30)

## 2023-05-06 ENCOUNTER — Other Ambulatory Visit (HOSPITAL_BASED_OUTPATIENT_CLINIC_OR_DEPARTMENT_OTHER): Payer: Self-pay

## 2023-10-01 ENCOUNTER — Other Ambulatory Visit: Payer: Self-pay

## 2023-10-01 ENCOUNTER — Emergency Department (HOSPITAL_BASED_OUTPATIENT_CLINIC_OR_DEPARTMENT_OTHER)
Admission: EM | Admit: 2023-10-01 | Discharge: 2023-10-01 | Disposition: A | Discharge status: Home / Self Care | Attending: Emergency Medicine | Admitting: Emergency Medicine

## 2023-10-01 ENCOUNTER — Encounter (HOSPITAL_BASED_OUTPATIENT_CLINIC_OR_DEPARTMENT_OTHER): Payer: Self-pay | Admitting: Emergency Medicine

## 2023-10-01 DIAGNOSIS — F1099 Alcohol use, unspecified with unspecified alcohol-induced disorder: Secondary | ICD-10-CM | POA: Diagnosis present

## 2023-10-01 DIAGNOSIS — F1090 Alcohol use, unspecified, uncomplicated: Secondary | ICD-10-CM

## 2023-10-01 DIAGNOSIS — Z91010 Allergy to peanuts: Secondary | ICD-10-CM | POA: Diagnosis not present

## 2023-10-01 DIAGNOSIS — Y908 Blood alcohol level of 240 mg/100 ml or more: Secondary | ICD-10-CM | POA: Diagnosis not present

## 2023-10-01 DIAGNOSIS — E876 Hypokalemia: Secondary | ICD-10-CM | POA: Insufficient documentation

## 2023-10-01 LAB — RAPID URINE DRUG SCREEN, HOSP PERFORMED
Amphetamines: NOT DETECTED
Barbiturates: NOT DETECTED
Benzodiazepines: NOT DETECTED
Cocaine: NOT DETECTED
Opiates: NOT DETECTED
Tetrahydrocannabinol: NOT DETECTED

## 2023-10-01 LAB — CBC
HCT: 42.7 % (ref 39.0–52.0)
Hemoglobin: 15.3 g/dL (ref 13.0–17.0)
MCH: 31.2 pg (ref 26.0–34.0)
MCHC: 35.8 g/dL (ref 30.0–36.0)
MCV: 87.1 fL (ref 80.0–100.0)
Platelets: 305 10*3/uL (ref 150–400)
RBC: 4.9 MIL/uL (ref 4.22–5.81)
RDW: 13.6 % (ref 11.5–15.5)
WBC: 7.8 10*3/uL (ref 4.0–10.5)
nRBC: 0 % (ref 0.0–0.2)

## 2023-10-01 LAB — COMPREHENSIVE METABOLIC PANEL
ALT: 36 U/L (ref 0–44)
AST: 24 U/L (ref 15–41)
Albumin: 4.1 g/dL (ref 3.5–5.0)
Alkaline Phosphatase: 51 U/L (ref 38–126)
Anion gap: 15 (ref 5–15)
BUN: 5 mg/dL — ABNORMAL LOW (ref 6–20)
CO2: 24 mmol/L (ref 22–32)
Calcium: 9 mg/dL (ref 8.9–10.3)
Chloride: 98 mmol/L (ref 98–111)
Creatinine, Ser: 0.86 mg/dL (ref 0.61–1.24)
GFR, Estimated: >60 mL/min (ref >60)
Glucose, Bld: 150 mg/dL — ABNORMAL HIGH (ref 70–99)
Potassium: 3.2 mmol/L — ABNORMAL LOW (ref 3.5–5.1)
Sodium: 137 mmol/L (ref 135–145)
Total Bilirubin: 0.5 mg/dL (ref 0.0–1.2)
Total Protein: 7.4 g/dL (ref 6.5–8.1)

## 2023-10-01 LAB — ETHANOL: Alcohol, Ethyl (B): 299 mg/dL — ABNORMAL HIGH (ref <10)

## 2023-10-01 MED ORDER — POTASSIUM CHLORIDE CRYS ER 20 MEQ PO TBCR
40.0000 meq | EXTENDED_RELEASE_TABLET | Freq: Once | ORAL | Status: AC
  Administered 2023-10-01: 40 meq via ORAL
  Filled 2023-10-01: qty 2

## 2023-10-01 MED ORDER — HYDROXYZINE HCL 25 MG PO TABS: 25.0000 mg | ORAL_TABLET | Freq: Four times a day (QID) | ORAL | 0 refills | Status: DC | PRN

## 2023-10-01 MED ORDER — CHLORDIAZEPOXIDE HCL 25 MG PO CAPS: ORAL_CAPSULE | ORAL | 0 refills | Status: DC

## 2023-10-01 NOTE — Discharge Instructions (Addendum)
 You were seen in the ER today for evaluation of your alcohol use.  I have included information on alcohol resources into this discharge paperwork.  Please make sure to call to get set up.  Additionally, I recommend looking into AA meetings.  You can look up meetings in your local area.  I am going to prescribe you the Librium to take.  Please take as prescribed.  It is okay for you to take the naltrexone with the Librium.  Again, please make sure to follow-up with your primary care provider to get you started back on the injectable naltrexone if this works for you.  If you start to have any shaking, seizure, hallucinations, disorientation, chest pain, shortness of breath, please return to your nearest emerged part for reevaluation.  If you have any other concerns, new or worsening symptoms, please return to your nearest emergency department for evaluation.  Contact a health care provider if: You drank more or for longer than you intended on more than one occasion. You often drink to the point of vomiting or passing out. You have problems in your life due to drinking, but you continue to drink. You keep drinking even though you feel anxious, depressed, or have experienced memory loss. You have stopped doing the things you used to enjoy in order to drink. You have to drink more than you used to in order to get the effect you want. You experience anxiety, sweating, nausea, shakiness, and trouble sleeping when you try to stop drinking. Get help right away if: You have serious withdrawal symptoms, including: Confusion. Racing heart. High blood pressure. Fever. These symptoms may be an emergency. Get help right away. Call 911. Do not wait to see if the symptoms will go away. Do not drive yourself to the hospital. Also, get help right away if: You have thoughts about hurting yourself or others. Take one of these steps if you feel like you may hurt yourself or others, or have thoughts about taking your  own life: Call 911. Call the National Suicide Prevention Lifeline at 716-069-7066 or 988. This is open 24 hours a day. Text the Crisis Text Line at 507-697-9782.

## 2023-10-01 NOTE — ED Triage Notes (Signed)
 Requesting detox from etoh. Last drink 4 hours pta. Reports daily intake of 8 liquor drinks and a couple beer per day. CIWA 2 during triage.

## 2023-10-01 NOTE — ED Provider Notes (Signed)
 [facility] Provider Note   CSN: [id number] Arrival date & time: 10/01/23  1624     History Chief Complaint  Patient presents with   Alcohol Problem    Requesting Detox    Billy Johnson is a 33 y.o. male with history of alcohol use disorder presents the emerged from today for evaluation requesting alcohol detox.  Patient's last drink was around noon today.  He reports that he drinks about a 12 pack of truly's a day.  He reports he is supple anxious/nervousness.  Denies any nausea, vomiting, chest pain, shortness of breath, black or bloody stools.  Denies any hallucinations, suicidal ideations, or homicidal ideations.  Patient denies any withdrawal seizures before.  He reports that he did have good success with Librium and being on Vivitrol injections.  Due to the cost of the Vivitrol injections however he did stop doing this which resulted in him resuming alcohol again.  He is here today voluntarily and has brought his partner and mother at bedside.   Alcohol Problem Pertinent negatives include no chest pain, no abdominal pain and no shortness of breath.       Home Medications Prior to Admission medications   Medication Sig Start Date End Date Taking? Authorizing Provider  buPROPion (WELLBUTRIN XL) 150 MG 24 hr tablet Take 150 mg by mouth daily. 10/14/22   [provider]  chlordiazePOXIDE (LIBRIUM) 25 MG capsule 50mg  PO TID x 1D, then 25-50mg  PO BID X 1D, then 25-50mg  PO QD X 1D Patient not taking: Reported on 04/22/2023 04/09/23   Terald Sleeper, MD  EPINEPHrine 0.3 mg/0.3 mL IJ SOAJ injection Inject 0.3 mg into the muscle as needed for anaphylaxis. Patient not taking: Reported on 04/22/2023 06/18/22   Claiborne Rigg, NP  gabapentin (NEURONTIN) 300 MG capsule Take 1 capsule (300 mg total) by mouth 3 (three) times daily as needed. 04/22/23   Alfredia Ferguson, PA-C  hydrOXYzine (VISTARIL) 50 MG capsule Take 50 mg by mouth  daily as needed for anxiety. 10/14/22   [provider]  naltrexone (DEPADE) 50 MG tablet Take 1 tablet (50 mg total) by mouth daily. 04/22/23   Alfredia Ferguson, PA-C      Allergies    Peanuts [peanut oil]    Review of Systems   Review of Systems  Constitutional:  Negative for chills and fever.  Respiratory:  Negative for shortness of breath.   Cardiovascular:  Negative for chest pain.  Gastrointestinal:  Negative for abdominal pain.  Psychiatric/Behavioral:  Negative for hallucinations and suicidal ideas. The patient is nervous/anxious.     Physical Exam Updated Vital Signs BP (!) 124/95   Pulse 87   Temp 98.5 F (36.9 C) (Oral)   Resp 10   Ht 5\' 9"  (1.753 m)   Wt 102.1 kg   SpO2 94%   BMI 33.23 kg/m  Physical Exam Vitals and nursing note reviewed.  Constitutional:      General: He is not in acute distress.    Appearance: He is not ill-appearing or toxic-appearing.     Comments: Does not appear in any acute distress. Lying on stretcher.  Eyes:     General: No scleral icterus. Cardiovascular:     Rate and Rhythm: Normal rate.  Pulmonary:     Effort: Pulmonary effort is normal. No respiratory distress.  Abdominal:     Palpations: Abdomen is soft.     Tenderness: There is no abdominal tenderness. There is no guarding  or rebound.  Skin:    General: Skin is warm and dry.  Neurological:     Mental Status: He is alert.     Motor: No tremor.  Psychiatric:        Attention and Perception: He does not perceive auditory or visual hallucinations.        Speech: Speech normal.        Behavior: Behavior is cooperative.        Thought Content: Thought content does not include homicidal or suicidal ideation. Thought content does not include homicidal or suicidal plan.     ED Results / Procedures / Treatments   Labs (all labs ordered are listed, but only abnormal results are displayed) Labs Reviewed  COMPREHENSIVE METABOLIC PANEL - Abnormal; Notable for the  following components:      Result Value   Potassium 3.2 (*)    Glucose, Bld 150 (*)    BUN 5 (*)    All other components within normal limits  ETHANOL - Abnormal; Notable for the following components:   Alcohol, Ethyl (B) 299 (*)    All other components within normal limits  CBC  RAPID URINE DRUG SCREEN, HOSP PERFORMED    EKG None  Radiology No results found.  Procedures Procedures   Medications Ordered in ED Medications - No data to display  ED Course/ Medical Decision Making/ A&P  Medical Decision Making Amount and/or Complexity of Data Reviewed Labs: ordered.  Risk Prescription drug management.   33 y.o. male presents to the ER for evaluation of alcohol use. Differential diagnosis includes but is not limited to withdrawal versus intoxication. Vital signs mildly elevated BP at 145/111. However, BP was 120/88 while I was in the room. No tachycardia. Physical exam as noted above.   Workup initiated in triage.   I independently reviewed and interpreted the patient's labs.  Ethanol elevated at 299.  CBC without abnormality.  UDS unremarkable.  CMP did show mildly decreased potassium 3.2.  Glucose at 150 with a BUN of 5.  No other electrolyte or LFT abnormalities.  He currently is only experiencing some anxiousness.  Denies any SI or HI.  He reports close assessed with Librium.  Family reports that they have naltrexone p.o. at home.  Discussed with pharmacy, is okay for patient take both of these together.  I have also sent him in some additional hydroxyzine to help with some anxiety if he has some breakthrough with treatment.  He does not have any outpatient follow-ups however they did recently get him under Medicaid and are working on getting him a primary care provider.  [facility].  I recommended a follow-up with him again.  Information for residential and outpatient substance use information given as well.  Encourage the  use of the AA programs as well.  Patient verbalizes an understanding and would like to stop drinking alcohol.  He appears cooperative and agrees with plan and treatment.  He is not having any overt signs of withdrawal.  There is no tremors present.  He clinically appears sober.  No tachycardia.  Will send the Librium into the 24-hour pharmacy for him to start.  Potassium is mildly decreased, did give him 40 mill equivalents today.  Recommended to follow-up in the next few days to see if this improves.  Strict return precautions discussed.  He stable for discharge home.  We discussed the results of the labs/imaging. The plan is Librium taper, outpatient  resources, follow with primary care. We discussed strict return precautions and red flag symptoms. The patient verbalized their understanding and agrees to the plan. The patient is stable and being discharged home in good condition.  Portions of this report may have been transcribed using voice recognition software. Every effort was made to ensure accuracy; however, inadvertent computerized transcription errors may be present.   Final Clinical Impression(s) / ED Diagnoses Final diagnoses:  Alcohol use disorder  Hypokalemia    Rx / DC Orders ED Discharge Orders          Ordered    chlordiazePOXIDE (LIBRIUM) 25 MG capsule        10/01/23 2201    hydrOXYzine (ATARAX) 25 MG tablet  Every 6 hours PRN        10/01/23 2204              Achille Rich, PA-C 10/02/23 0018    Vanetta Mulders, MD 10/02/23 [phone]

## 2023-10-20 ENCOUNTER — Encounter: Payer: Self-pay | Admitting: Physician Assistant

## 2023-10-20 ENCOUNTER — Ambulatory Visit (INDEPENDENT_AMBULATORY_CARE_PROVIDER_SITE_OTHER): Admitting: Physician Assistant

## 2023-10-20 VITALS — BP 127/71 | HR 84 | Ht 69.0 in | Wt 229.4 lb

## 2023-10-20 DIAGNOSIS — R739 Hyperglycemia, unspecified: Secondary | ICD-10-CM

## 2023-10-20 DIAGNOSIS — F32A Depression, unspecified: Secondary | ICD-10-CM

## 2023-10-20 DIAGNOSIS — L723 Sebaceous cyst: Secondary | ICD-10-CM

## 2023-10-20 DIAGNOSIS — J301 Allergic rhinitis due to pollen: Secondary | ICD-10-CM

## 2023-10-20 DIAGNOSIS — E876 Hypokalemia: Secondary | ICD-10-CM

## 2023-10-20 DIAGNOSIS — F1021 Alcohol dependence, in remission: Secondary | ICD-10-CM | POA: Diagnosis not present

## 2023-10-20 DIAGNOSIS — F419 Anxiety disorder, unspecified: Secondary | ICD-10-CM

## 2023-10-20 DIAGNOSIS — R41840 Attention and concentration deficit: Secondary | ICD-10-CM

## 2023-10-20 MED ORDER — GABAPENTIN 300 MG PO CAPS: 300.0000 mg | ORAL_CAPSULE | Freq: Three times a day (TID) | ORAL | 0 refills | Status: DC | PRN

## 2023-10-20 MED ORDER — FEXOFENADINE HCL 180 MG PO TABS: 180.0000 mg | ORAL_TABLET | Freq: Every day | ORAL | 0 refills | Status: DC

## 2023-10-20 MED ORDER — FLUTICASONE PROPIONATE 50 MCG/ACT NA SUSP: 2.0000 | Freq: Every day | NASAL | 0 refills | Status: DC

## 2023-10-20 MED ORDER — HYDROXYZINE HCL 25 MG PO TABS: 25.0000 mg | ORAL_TABLET | Freq: Four times a day (QID) | ORAL | 0 refills | Status: DC | PRN

## 2023-10-20 NOTE — Assessment & Plan Note (Addendum)
 Recent relapse, with ED visit for 'detox'. Pt reports he has not drank since 10/01/23.  Advised pt in primary care we are not a resource for ongoing alcohol abuse management. Recommending pt go to the [facility] for alcohol abuse, depression, anxiety treatment. Pt states he is busy but will consider. I did place a formal referral for psychiatry but advised the [facility] will be the most efficient resource.  I will temporarily refill hydroxyzine for acute anxiety and gabapentin, which he was on historically.

## 2023-10-20 NOTE — Progress Notes (Signed)
 Established patient visit   Patient: Billy Johnson   DOB: 1991-07-14   33 y.o. Male  MRN: [id number] Visit Date: 10/20/2023  Today's healthcare provider: Alfredia Ferguson, PA-C   Cc. ED follow up, medication refill  Subjective     Pt was seen in the ED for alcohol detox 10/01/23. Pt reported drinking a 12 pack of trulys daily.  History of librium use sucessfully and vivitrol injections which was cost prohibited. He was given a few librium and told to f/b with PCP. Labs revealed elevated glucose and low potassium, he was given potassium in ED but labs were not rechecked.  Pt reports a desire to restart all of his medicines. He reports he wants to get back on track, that he now has medication and wants to see about the ADHD referral we had previously discussed.   He also reports struggling with seasonal allergies and mentions a lump on his abdomen.   Medications: Outpatient Medications Prior to Visit  Medication Sig   EPINEPHrine 0.3 mg/0.3 mL IJ SOAJ injection Inject 0.3 mg into the muscle as needed for anaphylaxis. (Patient not taking: Reported on 10/20/2023)   [DISCONTINUED] buPROPion (WELLBUTRIN XL) 150 MG 24 hr tablet Take 150 mg by mouth daily. (Patient not taking: Reported on 10/20/2023)   [DISCONTINUED] chlordiazePOXIDE (LIBRIUM) 25 MG capsule 50mg  PO TID x 1D, then 25-50mg  PO BID X 1D, then 25-50mg  PO QD X 1D (Patient not taking: Reported on 10/20/2023)   [DISCONTINUED] gabapentin (NEURONTIN) 300 MG capsule Take 1 capsule (300 mg total) by mouth 3 (three) times daily as needed. (Patient not taking: Reported on 10/20/2023)   [DISCONTINUED] hydrOXYzine (ATARAX) 25 MG tablet Take 1 tablet (25 mg total) by mouth every 6 (six) hours as needed. (Patient not taking: Reported on 10/20/2023)   [DISCONTINUED] hydrOXYzine (VISTARIL) 50 MG capsule Take 50 mg by mouth daily as needed for anxiety. (Patient not taking: Reported on 10/20/2023)   [DISCONTINUED] naltrexone (DEPADE) 50 MG tablet Take  1 tablet (50 mg total) by mouth daily. (Patient not taking: Reported on 10/20/2023)   No facility-administered medications prior to visit.    Review of Systems  Constitutional:  Negative for fatigue and fever.  HENT:  Positive for congestion and postnasal drip.   Respiratory:  Negative for cough and shortness of breath.   Cardiovascular:  Negative for chest pain, palpitations and leg swelling.  Neurological:  Negative for dizziness and headaches.       Objective    BP 127/71   Pulse 84   Ht 5\' 9"  (1.753 m)   Wt 229 lb 6.4 oz (104.1 kg)   BMI 33.88 kg/m    Physical Exam Constitutional:      General: He is awake.     Appearance: He is well-developed.  HENT:     Head: Normocephalic.  Eyes:     Conjunctiva/sclera: Conjunctivae normal.  Cardiovascular:     Rate and Rhythm: Normal rate and regular rhythm.     Heart sounds: Normal heart sounds.  Pulmonary:     Effort: Pulmonary effort is normal.     Breath sounds: Normal breath sounds.  Skin:    General: Skin is warm.     Comments: Right lower abdomen there is a < 1 cm mass in the skin, no erythema, discharge  Neurological:     Mental Status: He is alert and oriented to person, place, and time.  Psychiatric:        Attention and Perception:  Attention normal.        Mood and Affect: Mood normal.        Speech: Speech normal.        Behavior: Behavior is cooperative.     No results found for any visits on 10/20/23.  Assessment & Plan    Alcohol use disorder, severe, in early remission (HCC) Depression, unspecified depression type Attention deficit Anxiety Assessment & Plan: Recent relapse, with ED visit for 'detox'. Pt reports he has not drank since 10/01/23.  Advised pt in primary care we are not a resource for ongoing alcohol abuse management. Recommending pt go to the [facility] for alcohol abuse, depression, anxiety treatment. Pt states he is busy but will consider. I did place a  formal referral for psychiatry but advised the [facility] will be the most efficient resource.  I will temporarily refill hydroxyzine for acute anxiety and gabapentin, which he was on historically.   Orders: -     Basic metabolic panel with GFR -     Gabapentin; Take 1 capsule (300 mg total) by mouth 3 (three) times daily as needed.  Dispense: 90 capsule; Refill: 0 -     Ambulatory referral to Psychiatry -     hydrOXYzine HCl; Take 1 tablet (25 mg total) by mouth every 6 (six) hours as needed.  Dispense: 60 tablet; Refill: 0  Hyperglycemia Glucose 150+ in ED. Ordering A1c. -     Hemoglobin A1c  Hypokalemia Was replaced in ED. Repeat bmp -     Basic metabolic panel with GFR  Seasonal allergic rhinitis due to pollen Pt prefers allegra, prescribed. Recommending saline nasal rinses and flonase daily - bid.   -     Fexofenadine HCl; Take 1 tablet (180 mg total) by mouth daily.  Dispense: 90 tablet; Refill: 0 -     Fluticasone Propionate; Place 2 sprays into both nostrils daily.  Dispense: 16 g; Refill: 0  Sebaceous cyst, abdomen Recommending warm compresses. If bothersome would refer to derm for removal.  Return if symptoms worsen or fail to improve.       Alfredia Ferguson, PA-C  [facility] at [facility] [phone] (phone) [phone] (fax)  [facility]

## 2023-10-20 NOTE — Patient Instructions (Addendum)
[facility]  Phone: [phone]  Address: [address] [place], [place] [address]

## 2023-10-21 LAB — BASIC METABOLIC PANEL WITH GFR
BUN: 10 mg/dL (ref 6–23)
CO2: 27 meq/L (ref 19–32)
Calcium: 9.7 mg/dL (ref 8.4–10.5)
Chloride: 101 meq/L (ref 96–112)
Creatinine, Ser: 1.14 mg/dL (ref 0.40–1.50)
GFR: 85.22 mL/min (ref >60.00)
Glucose, Bld: 111 mg/dL — ABNORMAL HIGH (ref 70–99)
Potassium: 4.3 meq/L (ref 3.5–5.1)
Sodium: 139 meq/L (ref 135–145)

## 2023-10-21 LAB — HEMOGLOBIN A1C: Hgb A1c MFr Bld: 5.6 % (ref 4.6–6.5)

## 2023-10-25 ENCOUNTER — Encounter: Payer: Self-pay | Admitting: Physician Assistant

## 2023-12-21 ENCOUNTER — Encounter: Payer: Self-pay | Admitting: Physician Assistant

## 2023-12-24 ENCOUNTER — Other Ambulatory Visit: Payer: Self-pay | Admitting: Physician Assistant

## 2023-12-24 DIAGNOSIS — J301 Allergic rhinitis due to pollen: Secondary | ICD-10-CM

## 2023-12-24 DIAGNOSIS — Z720 Tobacco use: Secondary | ICD-10-CM

## 2023-12-24 DIAGNOSIS — F1021 Alcohol dependence, in remission: Secondary | ICD-10-CM

## 2023-12-24 DIAGNOSIS — F419 Anxiety disorder, unspecified: Secondary | ICD-10-CM

## 2023-12-24 MED ORDER — VARENICLINE TARTRATE 1 MG PO TABS: 1.0000 mg | ORAL_TABLET | Freq: Two times a day (BID) | ORAL | 0 refills | Status: DC

## 2023-12-24 MED ORDER — GABAPENTIN 300 MG PO CAPS: 300.0000 mg | ORAL_CAPSULE | Freq: Three times a day (TID) | ORAL | 0 refills | Status: DC | PRN

## 2023-12-24 MED ORDER — HYDROXYZINE HCL 25 MG PO TABS: 25.0000 mg | ORAL_TABLET | Freq: Four times a day (QID) | ORAL | 0 refills | Status: DC | PRN

## 2023-12-24 MED ORDER — VARENICLINE TARTRATE 0.5 MG PO TABS: ORAL_TABLET | ORAL | 0 refills | Status: AC

## 2023-12-24 MED ORDER — FLUTICASONE PROPIONATE 50 MCG/ACT NA SUSP: 2.0000 | Freq: Every day | NASAL | 0 refills | Status: DC

## 2023-12-24 MED ORDER — FEXOFENADINE HCL 180 MG PO TABS: 180.0000 mg | ORAL_TABLET | Freq: Every day | ORAL | 0 refills | Status: DC

## 2024-01-28 ENCOUNTER — Other Ambulatory Visit: Payer: Self-pay | Admitting: *Deleted

## 2024-01-28 ENCOUNTER — Encounter (INDEPENDENT_AMBULATORY_CARE_PROVIDER_SITE_OTHER): Payer: Self-pay | Admitting: Physician Assistant

## 2024-01-28 DIAGNOSIS — M25552 Pain in left hip: Secondary | ICD-10-CM

## 2024-01-28 DIAGNOSIS — F1021 Alcohol dependence, in remission: Secondary | ICD-10-CM

## 2024-01-28 MED ORDER — GABAPENTIN 300 MG PO CAPS: 300.0000 mg | ORAL_CAPSULE | Freq: Three times a day (TID) | ORAL | 2 refills | Status: DC | PRN

## 2024-01-28 NOTE — Telephone Encounter (Signed)
 Please see the MyChart message reply(ies) for my assessment and plan.    This patient gave consent for this Medical Advice Message and is aware that it may result in a bill to [employer], as well as the possibility of receiving a bill for a co-payment or deductible. They are an established patient, but are not seeking medical advice exclusively about a problem treated during an in person or video visit in the last seven days. I did not recommend an in person or video visit within seven days of my reply.    I spent a total of 5 minutes cumulative time within 7 days through [employer].  Alfredia Ferguson, PA-C

## 2024-05-05 ENCOUNTER — Ambulatory Visit
Admission: RE | Admit: 2024-05-05 | Discharge: 2024-05-05 | Disposition: A | Discharge status: Home / Self Care | Attending: Emergency Medicine | Admitting: Emergency Medicine

## 2024-05-05 ENCOUNTER — Other Ambulatory Visit: Payer: Self-pay

## 2024-05-05 VITALS — BP 123/74 | HR 109 | Temp 98.4°F | Resp 12 | Ht 69.0 in | Wt 217.0 lb

## 2024-05-05 DIAGNOSIS — F1090 Alcohol use, unspecified, uncomplicated: Secondary | ICD-10-CM

## 2024-05-05 MED ORDER — CHLORDIAZEPOXIDE HCL 25 MG PO CAPS: ORAL_CAPSULE | ORAL | 0 refills | Status: DC

## 2024-05-05 MED ORDER — HYDROXYZINE HCL 25 MG PO TABS: 25.0000 mg | ORAL_TABLET | Freq: Four times a day (QID) | ORAL | 0 refills | Status: DC

## 2024-05-05 NOTE — ED Provider Notes (Signed)
 [facility]    CSN: [id number] Arrival date & time: 05/05/24  1306      History   Chief Complaint Chief Complaint  Patient presents with   Alcohol Problem    HPI Billy Johnson is a 33 y.o. male.   Patient presents to clinic over concern of alcohol use.  Presents with his significant other.  Reports last drink was around 10am today, drinking ETOH for about a week, drinks about a 12 pack per day. Hx of alcohol use disorder.  Usually does well on Librium  taper.  Has tried gabapentin  in the past without success.  Last Librium  taper was back in March, has done well with avoiding alcohol use until the past week.  Feels a little bit like his heart is racing.  Has not had nausea, vomiting, tremor, sweats, hallucinations or agitation.  No history of alcohol withdrawal seizures.  Denies SI or HI.  Partner reports patient is in school and would probably not benefit from inpatient rehab as this would disrupt his college education.    The history is provided by the patient and medical records.  Alcohol Problem    Past Medical History:  Diagnosis Date   Asthma    Avascular necrosis (HCC)    ETOH abuse    H. pylori infection    Hypertension     Patient Active Problem List   Diagnosis Date Noted   Attention deficit 04/22/2023   Depression 06/01/2022   Avascular necrosis of hip, right (HCC) 07/18/2021   Alcohol use disorder, severe, in early remission (HCC) 07/18/2021   Obesity (BMI 30.0-34.9) 07/18/2021   Vapes nicotine containing substance 07/18/2021   Essential hypertension 07/18/2021    Past Surgical History:  Procedure Laterality Date   TOTAL HIP ARTHROPLASTY  09/2020       Home Medications    Prior to Admission medications   Medication Sig Start Date End Date Taking? Authorizing Provider  chlordiazePOXIDE  (LIBRIUM ) 25 MG capsule 50mg  PO TID x 1D, then 25-50mg  PO BID X 1D, then 25-50mg  PO QD X 1D 05/05/24  Yes Yarrow Linhart  N, FNP   hydrOXYzine  (ATARAX ) 25 MG tablet Take 1 tablet (25 mg total) by mouth every 6 (six) hours. 05/05/24  Yes Burna Atlas  N, FNP  EPINEPHrine  0.3 mg/0.3 mL IJ SOAJ injection Inject 0.3 mg into the muscle as needed for anaphylaxis. Patient not taking: Reported on 10/20/2023 06/18/22   Fleming, Zelda W, NP  fexofenadine  (ALLEGRA  ALLERGY) 180 MG tablet Take 1 tablet (180 mg total) by mouth daily. 12/24/23   Cyndi Shaver, PA-C  fluticasone  (FLONASE ) 50 MCG/ACT nasal spray Place 2 sprays into both nostrils daily. 12/24/23   Cyndi Shaver, PA-C  gabapentin  (NEURONTIN ) 300 MG capsule Take 1 capsule (300 mg total) by mouth 3 (three) times daily as needed. 01/28/24   Drubel, Shaver, PA-C  varenicline  (CHANTIX  CONTINUING MONTH PAK) 1 MG tablet Take 1 tablet (1 mg total) by mouth 2 (two) times daily. Start after 0.5 mg prescription 12/24/23   Cyndi Shaver, PA-C    Family History Family History  Problem Relation Age of Onset   Breast cancer Mother    Diabetes Neg Hx    Prostate cancer Neg Hx    Colon cancer Neg Hx     Social History Social History   Tobacco Use   Smoking status: Former    Types: E-cigarettes   Smokeless tobacco: Never  Vaping Use   Vaping status: Every Day  Substance Use Topics   Alcohol  use: Yes    Comment: daily   Drug use: No     Allergies   Peanuts [peanut oil]   Review of Systems Review of Systems  Per HPI  Physical Exam Triage Vital Signs ED Triage Vitals  Encounter Vitals Group     BP 05/05/24 1402 123/74     Girls Systolic BP Percentile --      Girls Diastolic BP Percentile --      Boys Systolic BP Percentile --      Boys Diastolic BP Percentile --      Pulse Rate 05/05/24 1402 (!) 109     Resp 05/05/24 1402 12     Temp 05/05/24 1402 98.4 F (36.9 C)     Temp Source 05/05/24 1402 Oral     SpO2 05/05/24 1402 94 %     Weight 05/05/24 1402 217 lb (98.4 kg)     Height 05/05/24 1402 5' 9 (1.753 m)     Head Circumference --      Peak Flow --       Pain Score 05/05/24 1423 0     Pain Loc --      Pain Education --      Exclude from Growth Chart --    No data found.  Updated Vital Signs BP 123/74 (BP Location: Right Arm)   Pulse (!) 109   Temp 98.4 F (36.9 C) (Oral)   Resp 12   Ht 5' 9 (1.753 m)   Wt 217 lb (98.4 kg)   SpO2 94%   BMI 32.05 kg/m   Visual Acuity Right Eye Distance:   Left Eye Distance:   Bilateral Distance:    Right Eye Near:   Left Eye Near:    Bilateral Near:     Physical Exam Vitals and nursing note reviewed.  Constitutional:      Appearance: Normal appearance.  HENT:     Head: Normocephalic and atraumatic.     Right Ear: External ear normal.     Left Ear: External ear normal.     Nose: Nose normal.     Mouth/Throat:     Mouth: Mucous membranes are moist.  Eyes:     Conjunctiva/sclera: Conjunctivae normal.  Cardiovascular:     Rate and Rhythm: Regular rhythm. Tachycardia present.     Heart sounds: Normal heart sounds. No murmur heard. Pulmonary:     Effort: Pulmonary effort is normal. No respiratory distress.     Breath sounds: Normal breath sounds. No wheezing.  Skin:    General: Skin is warm and dry.  Neurological:     General: No focal deficit present.     Mental Status: He is alert and oriented to person, place, and time.  Psychiatric:        Mood and Affect: Mood normal.        Behavior: Behavior normal.      UC Treatments / Results  Labs (all labs ordered are listed, but only abnormal results are displayed) Labs Reviewed - No data to display  EKG   Radiology No results found.  Procedures Procedures (including critical care time)  Medications Ordered in UC Medications - No data to display  Initial Impression / Assessment and Plan / UC Course  I have reviewed the triage vital signs and the nursing notes.  Pertinent labs & imaging results that were available during my care of the patient were reviewed by me and considered in my medical decision making (see  chart for details).  Vitals and triage reviewed, patient is hemodynamically stable.  Tachycardic.  Lungs vesicular.  Without nausea, vomiting, tremor, sweats, hallucinations, headache or agitation.  Is feeling anxiety and is slightly tachycardic.  No history of alcohol withdrawal seizures.  Feel as if patient is safe for outpatient Librium  taper.  Will trial Librium  taper, has tolerated well in the past.  Hydroxyzine  as needed for anxiety.  Strict emergency precautions given if symptoms evolve or worsen.  Patient and partner verbalized understanding, no questions at this time.    Final Clinical Impressions(s) / UC Diagnoses   Final diagnoses:  Alcohol use disorder     Discharge Instructions      Use the Librium  taper as prescribed.  Can use hydroxyzine  for any breakthrough anxiety.  This medication may cause drowsiness.  Do not take the Librium  if you are going to drink alcohol, this will make you violently ill.  Monitor for worsening withdrawal symptoms such as hallucinations, agitation, tremor, sweats, worsening anxiety, severe headache or agitation.  Worst case would be seizures and death.  If any of these worsening symptoms develop please call 911 or seek immediate care at the nearest emergency department.  It is important that you follow-up with a primary care provider.  Seek immediate care at behavioral health urgent care for any urgent behavioral health needs.      ED Prescriptions     Medication Sig Dispense Auth. Provider   chlordiazePOXIDE  (LIBRIUM ) 25 MG capsule 50mg  PO TID x 1D, then 25-50mg  PO BID X 1D, then 25-50mg  PO QD X 1D 10 capsule Dreama, Markeis Allman  N, FNP   hydrOXYzine  (ATARAX ) 25 MG tablet Take 1 tablet (25 mg total) by mouth every 6 (six) hours. 12 tablet Dreama, Myliyah Rebuck  N, FNP      I have reviewed the PDMP during this encounter.   Dreama Joellen SAILOR, FNP 05/05/24 1456

## 2024-05-05 NOTE — ED Triage Notes (Addendum)
 Pt presents to urgent care with concerns of alcohol problems. States his last drink was this morning at 10 AM. Was doing good up until now. Stopped drinking in March but recently started to drink the last two weeks. No pain. Feels like his heart is racing at times. His PA for primary care moved away, no longer part of practice. Unable to follow up with her.

## 2024-05-05 NOTE — Discharge Instructions (Addendum)
 Use the Librium  taper as prescribed.  Can use hydroxyzine  for any breakthrough anxiety.  This medication may cause drowsiness.  Do not take the Librium  if you are going to drink alcohol, this will make you violently ill.  Monitor for worsening withdrawal symptoms such as hallucinations, agitation, tremor, sweats, worsening anxiety, severe headache or agitation.  Worst case would be seizures and death.  If any of these worsening symptoms develop please call 911 or seek immediate care at the nearest emergency department.  It is important that you follow-up with a primary care provider.  Seek immediate care at behavioral health urgent care for any urgent behavioral health needs.

## 2024-07-17 ENCOUNTER — Ambulatory Visit: Payer: Self-pay

## 2024-07-17 NOTE — Telephone Encounter (Signed)
 FYI Only or Action Required?: Action required by provider: update on patient condition and will go to UC until he can be seen in office.  Patient was last seen in primary care on 10/20/2023 by Cyndi Shaver, PA-C.  Called Nurse Triage reporting Alcohol Problem.  Symptoms began several weeks ago.  Interventions attempted: Nothing.  Symptoms are: unchanged.  Triage Disposition: See Physician Within 24 Hours  Patient/caregiver understands and will follow disposition?: Yes  Copied from CRM #[id number]. Topic: Clinical - Red Word Triage >> Jul 17, 2024 12:53 PM Antony RAMAN wrote: Red Word that prompted transfer to Nurse Triage: binge drinking the last 3 weeks, wanting to get on top of it. Seems a bit desperate for help Reason for Disposition  [1] Drinking alcohol daily AND [2] prior alcohol withdrawal seizures  Answer Assessment - Initial Assessment Questions 1. ALCOHOL USE: Do you drink alcohol, including beer, wine or hard liquor?     Truly-hard seltzer 2. HOW OFTEN: How many days per week do you typically drink alcohol?     Binge spurts. This time for the past few weeks 3. HOW MUCH: How many drinks do you typically have on days when you drink? (1.5 oz hard liquor [one shot or jigger; 45 ml], 5 oz wine [small glass; 150 ml], 12 oz beer [one can; 360 ml])     18/day  5. LAST 24 HOURS: Have you had a drink within the last 24 hours?     A case and one half in the last day 6. DRINKING PROBLEM: Do you have or have you ever had an alcohol drinking problem?     Yes, has had binging problems with alcohol in the past 7. DRUG PROBLEM: Are you using any other drugs? (e.g., yes/no; cocaine, prescription medicines, etc.)     Alcohol only 8. SYMPTOMS: What symptoms are you currently experiencing? (e.g., none, tremors, or shakiness, abdomen pain, vomiting, blackout spells)     Occasional right sided abdominal pain 9. TREATMENT PROGRAM: Have you ever gone through an alcohol use  treatment program?     Yes, once before kind of [facility] in [place] 10. THERAPIST: Do you have a counselor or therapist? If Yes, ask: What is their name?       No current therapist.  States that he needs a behavioral health therapist 17. SUPPORT: Who is with you now? Who do you live with? Do you have family or friends who you can talk to? Are you a member of Alcoholics Anonymous?       Lives with partner, supportive relationship. Has been to one AA meeting  Answer Assessment - Initial Assessment Questions 1. CONCERN: Did anything happen that prompted you to call today?      Alcohol use 2. ANXIETY SYMPTOMS: Can you describe how you (your loved one; patient) have been feeling? (e.g., tense, restless, panicky, anxious, keyed up, overwhelmed, sense of impending doom).      Overwhelmed, scared, and excited after graduating from school recently. Misses the structure of school.  3. ONSET: How long have you been feeling this way? (e.g., hours, days, weeks)     Has been on drinking binge for about three weeks.  Seeking treatment to stop drinking  Protocols used: Alcohol Use and Problems-A-AH, Anxiety and Panic Attack-A-AH

## 2024-07-25 ENCOUNTER — Ambulatory Visit: Admitting: Family Medicine

## 2024-07-25 ENCOUNTER — Ambulatory Visit (HOSPITAL_COMMUNITY)
Admission: EM | Admit: 2024-07-25 | Discharge: 2024-07-25 | Disposition: A | Discharge status: Other Acute Inpatient Hospital | Attending: Licensed Clinical Social Worker | Admitting: Licensed Clinical Social Worker

## 2024-07-25 ENCOUNTER — Other Ambulatory Visit (HOSPITAL_COMMUNITY)
Admission: EM | Admit: 2024-07-25 | Discharge: 2024-07-28 | Disposition: A | Discharge status: Home / Self Care | Source: Intra-hospital

## 2024-07-25 VITALS — BP 148/97 | HR 84 | Temp 97.5°F | Ht 69.0 in | Wt 220.0 lb

## 2024-07-25 DIAGNOSIS — Z659 Problem related to unspecified psychosocial circumstances: Secondary | ICD-10-CM | POA: Insufficient documentation

## 2024-07-25 DIAGNOSIS — Z79899 Other long term (current) drug therapy: Secondary | ICD-10-CM | POA: Insufficient documentation

## 2024-07-25 DIAGNOSIS — R45851 Suicidal ideations: Secondary | ICD-10-CM | POA: Diagnosis not present

## 2024-07-25 DIAGNOSIS — F32A Depression, unspecified: Secondary | ICD-10-CM | POA: Diagnosis not present

## 2024-07-25 DIAGNOSIS — F1010 Alcohol abuse, uncomplicated: Secondary | ICD-10-CM | POA: Diagnosis not present

## 2024-07-25 DIAGNOSIS — F19239 Other psychoactive substance dependence with withdrawal, unspecified: Secondary | ICD-10-CM | POA: Insufficient documentation

## 2024-07-25 DIAGNOSIS — F1020 Alcohol dependence, uncomplicated: Secondary | ICD-10-CM | POA: Diagnosis not present

## 2024-07-25 DIAGNOSIS — I10 Essential (primary) hypertension: Secondary | ICD-10-CM | POA: Insufficient documentation

## 2024-07-25 DIAGNOSIS — Z7689 Persons encountering health services in other specified circumstances: Secondary | ICD-10-CM

## 2024-07-25 DIAGNOSIS — Z560 Unemployment, unspecified: Secondary | ICD-10-CM | POA: Insufficient documentation

## 2024-07-25 DIAGNOSIS — Y908 Blood alcohol level of 240 mg/100 ml or more: Secondary | ICD-10-CM | POA: Insufficient documentation

## 2024-07-25 LAB — POCT URINE DRUG SCREEN - MANUAL ENTRY (I-SCREEN)
POC Amphetamine UR: NOT DETECTED
POC Buprenorphine (BUP): NOT DETECTED
POC Cocaine UR: NOT DETECTED
POC Marijuana UR: NOT DETECTED
POC Methadone UR: NOT DETECTED
POC Methamphetamine UR: NOT DETECTED
POC Morphine: NOT DETECTED
POC Oxazepam (BZO): NOT DETECTED
POC Oxycodone UR: NOT DETECTED
POC Secobarbital (BAR): NOT DETECTED

## 2024-07-25 MED ORDER — LORAZEPAM 2 MG/ML IJ SOLN: 2.0000 mg | Freq: Three times a day (TID) | INTRAMUSCULAR | Status: DC | PRN

## 2024-07-25 MED ORDER — HALOPERIDOL LACTATE 5 MG/ML IJ SOLN: 5.0000 mg | Freq: Three times a day (TID) | INTRAMUSCULAR | Status: DC | PRN

## 2024-07-25 MED ORDER — HYDROXYZINE HCL 25 MG PO TABS
25.0000 mg | ORAL_TABLET | Freq: Four times a day (QID) | ORAL | Status: DC | PRN
  Administered 2024-07-26 – 2024-07-28 (×6): 25 mg via ORAL
  Filled 2024-07-25 (×6): qty 1

## 2024-07-25 MED ORDER — CHLORDIAZEPOXIDE HCL 25 MG PO CAPS: 25.0000 mg | ORAL_CAPSULE | Freq: Four times a day (QID) | ORAL | Status: DC | PRN

## 2024-07-25 MED ORDER — HALOPERIDOL 5 MG PO TABS: 5.0000 mg | ORAL_TABLET | Freq: Three times a day (TID) | ORAL | Status: DC | PRN

## 2024-07-25 MED ORDER — MAGNESIUM HYDROXIDE 400 MG/5ML PO SUSP: 30.0000 mL | Freq: Every day | ORAL | Status: DC | PRN

## 2024-07-25 MED ORDER — ADULT MULTIVITAMIN W/MINERALS CH
1.0000 | ORAL_TABLET | Freq: Every day | ORAL | Status: DC
  Administered 2024-07-26 – 2024-07-28 (×3): 1 via ORAL
  Filled 2024-07-25 (×3): qty 1

## 2024-07-25 MED ORDER — THIAMINE HCL 100 MG/ML IJ SOLN
100.0000 mg | Freq: Once | INTRAMUSCULAR | Status: AC
  Administered 2024-07-26: 100 mg via INTRAMUSCULAR
  Filled 2024-07-25: qty 2

## 2024-07-25 MED ORDER — ALUM & MAG HYDROXIDE-SIMETH 200-200-20 MG/5ML PO SUSP: 30.0000 mL | ORAL | Status: DC | PRN

## 2024-07-25 MED ORDER — DIPHENHYDRAMINE HCL 50 MG/ML IJ SOLN: 50.0000 mg | Freq: Three times a day (TID) | INTRAMUSCULAR | Status: DC | PRN

## 2024-07-25 MED ORDER — ACETAMINOPHEN 325 MG PO TABS: 650.0000 mg | ORAL_TABLET | Freq: Four times a day (QID) | ORAL | Status: DC | PRN

## 2024-07-25 MED ORDER — DIPHENHYDRAMINE HCL 50 MG PO CAPS: 50.0000 mg | ORAL_CAPSULE | Freq: Three times a day (TID) | ORAL | Status: DC | PRN

## 2024-07-25 MED ORDER — ONDANSETRON 4 MG PO TBDP: 4.0000 mg | ORAL_TABLET | Freq: Four times a day (QID) | ORAL | Status: DC | PRN

## 2024-07-25 MED ORDER — LOPERAMIDE HCL 2 MG PO CAPS: 2.0000 mg | ORAL_CAPSULE | ORAL | Status: DC | PRN

## 2024-07-25 MED ORDER — HALOPERIDOL LACTATE 5 MG/ML IJ SOLN: 10.0000 mg | Freq: Three times a day (TID) | INTRAMUSCULAR | Status: DC | PRN

## 2024-07-25 NOTE — BH Assessment (Addendum)
 Comprehensive Clinical Assessment (CCA) Note  07/25/2024 Billy Johnson [id number]  Disposition: Gaither Pouch, NP, recommends admission to [facility].   The patient demonstrates the following risk factors for suicide: Chronic risk factors for suicide include: substance use disorder. Acute risk factors for suicide include: unemployment, social withdrawal/isolation, and loss (financial, interpersonal, professional). Protective factors for this patient include: responsibility to others (children, family) and hope for the future. Considering these factors, the overall suicide risk at this point appears to be high. Patient is not appropriate for outpatient follow up.  Billy Johnson is a 34 year old male presenting as a voluntary walk-in to [facility] requesting alcohol detox. Patient reports history of alcohol abuse. Patient denied SI, HI, psychosis and drug usage. Patient is accompanied by his partner, Billy Johnson. Patient gave consent for partner to be present during assessment.   Patient reports alcohol addiction. Patient reports drinking alcohol daily, 2x 12 packs for the past 4 weeks. Patient reports drinking alcohol on and off for the past 7 years. Patient reports drinking 10 Truly Hard Seltzers within the last 24 hours, with the last 22 ounce alcohol drink 1 hour ago. Patient reports stressors include recent life changes and alcohol addiction. Patient reports he attempted to detox from librium  at home 06/2024 and detox from [facility] a few years ago. Patient reports worsening depressive symptoms. Patient reports poor sleep of 4-5 hours nightly and poor appetite.     Patient does not have a therapist or a psychiatrist.  Patient denied taking any psychiatric medications.  Patient denied prior psychiatric hospitalizations.  Patient denies prior suicide attempts and self-harm behaviors. Patient resides with partner. Patient is currently unemployed. Patient denied access to guns.  Patient was calm and  cooperative during assessment.  Chief Complaint:  Chief Complaint  Patient presents with   Alcohol Problem   Suicidal Ideation   Visit Diagnosis:  Alcohol Dependence    CCA Screening, Triage and Referral (STR)  Patient Reported Information How did you hear about us ? Self  What Is the Reason for Your Visit/Call Today? Pt presents to [facility] as a voluntary walk-in, accompanied by his family requesting substance abuse treatment and SI, without plan or intent. Pt reports drinking 10 Truly Hard Seltzers within the last 24 hours. He reports drinking a large amount daily for about a month. Pt is very tearful during triage process and reports recent life changes as his immediate stressors/triggers. Pt reports past substance abuse treatment at [facility] a few years ago. Pt denies prior suicide attempts and self-injurious behaviors. Pt is able to contract for safety. Pt denies being established with outpatient therapist at this time. Pt currently denies HI,AVH.  How Long Has This Been Causing You Problems? 4 weeks What Do You Feel Would Help You the Most Today? Alcohol or Drug Use Treatment; Treatment for Depression or other mood problem   Have You Recently Had Any Thoughts About Hurting Yourself? Yes  Are You Planning to Commit Suicide/Harm Yourself At This time? No   Flowsheet Row ED from 07/25/2024 in [facility] from [facility]2024 [facility] ED from 10/01/2023 in [facility] Emergency Department at [facility]  C-SSRS RISK CATEGORY Low Risk No Risk No Risk    Have you Recently Had Thoughts About Hurting Someone Sherral? No  Are You Planning to Harm Someone at This Time? No  Explanation: n/a   Have You Used Any Alcohol or Drugs in the  Past 24 Hours? Yes  How Long Ago Did You Use Drugs or Alcohol? 1 hour ago What Did You Use and How Much? 10 Truly Hard Seltzers   Do You Currently Have a  Therapist/Psychiatrist? No  Name of Therapist/Psychiatrist:  n/a  Have You Been Recently Discharged From Any Office Practice or Programs? No  Explanation of Discharge From Practice/Program: n/a    CCA Screening Triage Referral Assessment Type of Contact: Face-to-Face  Telemedicine Service Delivery:  n/a Is this Initial or Reassessment?  N/a Date Telepsych consult ordered in [facility]:   N/a Time Telepsych consult ordered in [facility]:   N/a Location of Assessment: [facility] Services  Provider Location: [facility] Assessment Services   Collateral Involvement: partner, Billy Johnson   Does Patient Have a Automotive Engineer Guardian? No  Legal Guardian Contact Information: n/a  Copy of Legal Guardianship Form: -- (n/a)  Legal Guardian Notified of Arrival: -- (n/a)  Legal Guardian Notified of Pending Discharge: -- (n/a)  If Minor and Not Living with Parent(s), Who has Custody? n/a  Is CPS involved or ever been involved? Never  Is APS involved or ever been involved? Never   Patient Determined To Be At Risk for Harm To Self or Others Based on Review of Patient Reported Information or Presenting Complaint? No  Method: No Plan  Availability of Means: No access or NA  Intent: Vague intent or NA  Notification Required: No need or identified person  Additional Information for Danger to Others Potential: -- (n/a)  Additional Comments for Danger to Others Potential: n/a  Are There Guns or Other Weapons in Your Home? No  Types of Guns/Weapons: n/a  Are These Weapons Safely Secured?                            -- (n/a)  Who Could Verify You Are Able To Have These Secured: n/a  Do You Have any Outstanding Charges, Pending Court Dates, Parole/Probation? none reported  Contacted To Inform of Risk of Harm To Self or Others: Family/Significant Other:    Does Patient Present under Involuntary Commitment? No    [place] of Residence: [place]   Patient Currently Receiving  the Following Services: Not Receiving Services   Determination of Need: Urgent (48 hours)   Options For Referral: Other: Comment; Chemical Dependency Intensive Outpatient Therapy (CDIOP); Facility-Based Crisis; Outpatient Therapy   CCA Biopsychosocial Patient Reported Schizophrenia/Schizoaffective Diagnosis in Past: No   Strengths: self-awareness   Mental Health Symptoms Depression:  Fatigue; Increase/decrease in appetite; Sleep (too much or little); Hopelessness; Tearfulness   Duration of Depressive symptoms: Duration of Depressive Symptoms: Greater than two weeks   Mania:  None   Anxiety:   Worrying; Sleep   Psychosis:  None   Duration of Psychotic symptoms:    Trauma:  None   Obsessions:  None   Compulsions:  None   Inattention:  None   Hyperactivity/Impulsivity:  N/A   Oppositional/Defiant Behaviors:  N/A   Emotional Irregularity:  N/A   Other Mood/Personality Symptoms:  n/a    Mental Status Exam Appearance and self-care  Stature:  Average   Weight:  Average weight   Clothing:  Age-appropriate   Grooming:  Normal   Cosmetic use:  None   Posture/gait:  Normal   Motor activity:  Not Remarkable   Sensorium  Attention:  Normal   Concentration:  Normal   Orientation:  X5   Recall/memory:  Normal  Affect and Mood  Affect:  Anxious; Appropriate; Depressed   Mood:  Anxious; Depressed   Relating  Eye contact:  Normal   Facial expression:  Anxious   Attitude toward examiner:  Cooperative   Thought and Language  Speech flow: Clear and Coherent   Thought content:  Appropriate to Mood and Circumstances   Preoccupation:  None   Hallucinations:  None   Organization:  Coherent   [employer] of Knowledge:  Average   Intelligence:  Average   Abstraction:  Normal   Judgement:  Fair   Dance Movement Psychotherapist:  Realistic   Insight:  Fair   Decision Making:  Normal   Social Functioning  Social Maturity:  Isolates    Social Judgement:  Normal   Stress  Stressors:  Other (Comment)   Coping Ability:  Overwhelmed   Skill Deficits:  Self-control   Supports:  Family; Support needed     Religion: Religion/Spirituality Are You A Religious Person?: Yes How Might This Affect Treatment?: n/a  Leisure/Recreation: Leisure / Recreation Do You Have Hobbies?: Yes Leisure and Hobbies: workign out and video games  Exercise/Diet: Exercise/Diet Do You Exercise?: Yes What Type of Exercise Do You Do?: Weight Training How Many Times a Week Do You Exercise?: 1-3 times a week Have You Gained or Lost A Significant Amount of Weight in the Past Six Months?: No Do You Follow a Special Diet?: No Do You Have Any Trouble Sleeping?: Yes Explanation of Sleeping Difficulties: 4-5 nightly   CCA Employment/Education Employment/Work Situation: Employment / Work Situation Employment Situation: Unemployed Patient's Job has Been Impacted by Current Illness: No Has Patient ever Been in Equities Trader?: No  Education: Education Is Patient Currently Attending School?: No Last Grade Completed: 12 Did You Product Manager?: Yes What Type of College Degree Do you Have?: BS and Associates Degree Did You Have An Individualized Education Program (IIEP): No Did You Have Any Difficulty At School?: No Patient's Education Has Been Impacted by Current Illness: No   CCA Family/Childhood History Family and Relationship History: Family history Marital status: Single Does patient have children?: No  Childhood History:  Childhood History By whom was/is the patient raised?: Both parents Did patient suffer any verbal/emotional/physical/sexual abuse as a child?: No Did patient suffer from severe childhood neglect?: No Has patient ever been sexually abused/assaulted/raped as an adolescent or adult?: No Was the patient ever a victim of a crime or a disaster?: No Witnessed domestic violence?: No Has patient been affected by  domestic violence as an adult?: No  CCA Substance Use Alcohol/Drug Use: Alcohol / Drug Use Pain Medications: see MAR Prescriptions: see MAR Over the Counter: see MAR History of alcohol / drug use?: Yes Longest period of sobriety (when/how long): unknown Negative Consequences of Use: Financial, Personal relationships, Work / Programmer, Multimedia Withdrawal Symptoms: Sweats Substance #1 Name of Substance 1: alcohol 1 - Amount (size/oz): 2x 12 packs 1 - Frequency: daily 1 - Duration: 4 weeks 1 - Last Use / Amount: 1 hour ago     ASAM's:  Six Dimensions of Multidimensional Assessment  Dimension 1:  Acute Intoxication and/or Withdrawal Potential:   Dimension 1:  Description of individual's past and current experiences of substance use and withdrawal: reports drinks alcohol during the day to avoid withdrawal symptoms, last drank PTA  Dimension 2:  Biomedical Conditions and Complications:   Dimension 2:  Description of patient's biomedical conditions and  complications: none reported  Dimension 3:  Emotional, Behavioral, or Cognitive Conditions and Complications:  Dimension 3:  Description of emotional, behavioral, or cognitive conditions and complications: no history of mental illness  Dimension 4:  Readiness to Change:  Dimension 4:  Description of Readiness to Change criteria: seeking treatment  Dimension 5:  Relapse, Continued use, or Continued Problem Potential:  Dimension 5:  Relapse, continued use, or continued problem potential critiera description: expresses desire to change  Dimension 6:  Recovery/Living Environment:  Dimension 6:  Recovery/Iiving environment criteria description: lives with supportive partner, parents close by who are supportive  ASAM Severity Score: ASAM's Severity Rating Score: 6  ASAM Recommended Level of Treatment: ASAM Recommended Level of Treatment: Level II Intensive Outpatient Treatment   Substance use Disorder (SUD) Substance Use Disorder (SUD)  Checklist Symptoms  of Substance Use: Continued use despite having a persistent/recurrent physical/psychological problem caused/exacerbated by use, Continued use despite persistent or recurrent social, interpersonal problems, caused or exacerbated by use, Evidence of tolerance, Evidence of withdrawal (Comment), Persistent desire or unsuccessful efforts to cut down or control use, Repeated use in physically hazardous situations, Recurrent use that results in a failure to fulfill major role obligations (work, school, home), Presence of craving or strong urge to use, Substance(s) often taken in larger amounts or over longer times than was intended  Recommendations for Services/Supports/Treatments: Recommendations for Services/Supports/Treatments Recommendations For Services/Supports/Treatments: Detox, Individual Therapy, Facility Based Crisis  Disposition Recommendation per psychiatric provider:  Recommends admission to [facility].   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     Referrals to Alternative Service(s): Referred to Alternative Service(s):   Place:   Date:   Time:    Referred to Alternative Service(s):   Place:   Date:   Time:    Referred to Alternative Service(s):   Place:   Date:   Time:    Referred to Alternative Service(s):   Place:   Date:   Time:     Rutherford JONETTA Childes, [facility]

## 2024-07-25 NOTE — Progress Notes (Signed)
 "  New Patient Office Visit  Subjective    Patient ID: Billy Johnson, male    DOB: [birth year]  Age: 34 y.o. MRN: [id number]  CC:  Chief Complaint  Patient presents with   Establish Care    HPI [name] presents to establish care with this practice. He is new to me. Present today with his partner. Recently completed college. Anxious person, gets overwhelmed and then he drinks. Drinking a lot recently. Trying to get off of it I am not having a good time He is a [facility] resident. Been dealing with this for 7 years.   Has been in [facility] in [place] several years ago. Went to the ED in October for same. Has always detox  at home with Librium . Last drink one hour.  Tearful in the office. Partner discussing with Aboubacar about going to [facility] for treatment for detox and referral to appropriate resources per this provider's recommendation.    Chart review:  07/17/24: triage note Alcohol problem: binge drinking for 3 weeks  05/05/24: urgent care gave librium  50 mg TID x 1 day, 50 mg BID x 1 day Hydroxyzine  25 mg every 6 hours      Outpatient Encounter Medications as of 07/25/2024    Outpatient Encounter Medications as of 07/25/2024  Medication Sig   chlordiazePOXIDE  (LIBRIUM ) 25 MG capsule 50mg  PO TID x 1D, then 25-50mg  PO BID X 1D, then 25-50mg  PO QD X 1D   EPINEPHrine  0.3 mg/0.3 mL IJ SOAJ injection Inject 0.3 mg into the muscle as needed for anaphylaxis. (Patient not taking: Reported on 10/20/2023)   fexofenadine  (ALLEGRA  ALLERGY) 180 MG tablet Take 1 tablet (180 mg total) by mouth daily.   fluticasone  (FLONASE ) 50 MCG/ACT nasal spray Place 2 sprays into both nostrils daily.   gabapentin  (NEURONTIN ) 300 MG capsule Take 1 capsule (300 mg total) by mouth 3 (three) times daily as needed.   hydrOXYzine  (ATARAX ) 25 MG tablet Take 1 tablet (25 mg total) by mouth every 6 (six) hours.   varenicline  (CHANTIX  CONTINUING MONTH PAK) 1 MG tablet Take 1 tablet (1 mg  total) by mouth 2 (two) times daily. Start after 0.5 mg prescription   No facility-administered encounter medications on file as of 07/25/2024.    Past Medical History:  Diagnosis Date   Asthma    Avascular necrosis (HCC)    ETOH abuse    H. pylori infection    Hypertension     Past Surgical History:  Procedure Laterality Date   TOTAL HIP ARTHROPLASTY  09/2020    Family History  Problem Relation Age of Onset   Breast cancer Mother    Diabetes Neg Hx    Prostate cancer Neg Hx    Colon cancer Neg Hx     Social History   Socioeconomic History   Marital status: Media Planner    Spouse name: Not on file   Number of children: Not on file   Years of education: Not on file   Highest education level: Bachelor's degree (e.g., BA, AB, BS)  Occupational History   Not on file  Tobacco Use   Smoking status: Former    Types: E-cigarettes   Smokeless tobacco: Never  Vaping Use   Vaping status: Every Day  Substance and Sexual Activity   Alcohol use: Yes    Comment: daily   Drug use: No   Sexual activity: Yes  Other Topics Concern   Not on file  Social History Narrative  Lives with his partner    Social Drivers of Health   Tobacco Use: Medium Risk (07/25/2024)   Patient History    Smoking Tobacco Use: Former    Smokeless Tobacco Use: Never    Passive Exposure: Not on file  Financial Resource Strain: Low Risk (07/25/2024)   Overall Financial Resource Strain (CARDIA)    Difficulty of Paying Living Expenses: Not hard at all  Food Insecurity: No Food Insecurity (07/25/2024)   Epic    Worried About Programme Researcher, Broadcasting/film/video in the Last Year: Never true    Ran Out of Food in the Last Year: Never true  Transportation Needs: No Transportation Needs (07/25/2024)   Epic    Lack of Transportation (Medical): No    Lack of Transportation (Non-Medical): No  Physical Activity: Sufficiently Active (07/25/2024)   Exercise Vital Sign    Days of Exercise per Week: 4 days    Minutes of Exercise  per Session: 100 min  Stress: Stress Concern Present (07/25/2024)   [employer] of Occupational Health - Occupational Stress Questionnaire    Feeling of Stress: Very much  Social Connections: Moderately Isolated (07/25/2024)   Social Connection and Isolation Panel    Frequency of Communication with Friends and Family: Twice a week    Frequency of Social Gatherings with Friends and Family: Twice a week    Attends Religious Services: Never    Database Administrator or Organizations: No    Attends Engineer, Structural: Not on file    Marital Status: Living with partner  Intimate Partner Violence: Not on file  Depression (PHQ2-9): Medium Risk (10/20/2023)   Depression (PHQ2-9)    PHQ-2 Score: 6  Alcohol Screen: High Risk (07/25/2024)   Alcohol Screen    Last Alcohol Screening Score (AUDIT): 24  Housing: Low Risk (07/25/2024)   Epic    Unable to Pay for Housing in the Last Year: No    Number of Times Moved in the Last Year: 0    Homeless in the Last Year: No  Utilities: Not on file  Health Literacy: Not on file    ROS      Objective    BP (!) 148/97 (BP Location: Left Arm, Patient Position: Sitting, Cuff Size: Large)   Pulse 84   Temp (!) 97.5 F (36.4 C) (Oral)   Ht 5' 9 (1.753 m)   Wt 220 lb (99.8 kg)   SpO2 95%   BMI 32.49 kg/m   Physical Exam Vitals and nursing note reviewed.  Constitutional:      Appearance: Normal appearance.  Pulmonary:     Effort: Pulmonary effort is normal.  Skin:    General: Skin is warm and dry.  Neurological:     General: No focal deficit present.     Mental Status: He is alert. Mental status is at baseline.  Psychiatric:        Mood and Affect: Mood is anxious. Affect is tearful.        Speech: Speech normal.        Behavior: Behavior is cooperative.        Assessment & Plan:   Problem List Items Addressed This Visit     Establishing care with new doctor, encounter for - Primary   Alcohol use disorder, severe,  dependence (HCC)   Ongoing issue with alcohol use disorder. Present for years. Last drink one hour ago. Discussed provider recommendation that he needs to go to [facility] in [place] for evaluation as  he is a [facility] resident. Eligah and his partner agree to go to [facility] now. Address provided.      Agrees with plan of care discussed.  Questions answered.   Return if symptoms worsen or fail to improve.   Darice JONELLE Brownie, FNP   "

## 2024-07-25 NOTE — ED Provider Notes (Signed)
 Facility Based Crisis Admission H&P  Date: 07/26/2024 Patient Name: Billy Johnson MRN: [id number] Chief Complaint: need detox from alcohol  Diagnoses:  Final diagnoses:  Alcohol abuse  Depression, unspecified depression type    HPI: Billy Johnson, 34 y/o male with a history of alcohol abuse, presented to [facility] voluntarily accompanied by his partner.  According to the patient he is trying to get detox from alcohol.  According to him.  He drinks two 12 pack daily.  And he has been drinking heavily for the past 4 weeks however he has been consuming alcohol for years.  According to the patient he lives with his partner currently unemployed.  Currently not seeing a psychiatrist or therapist.  According to patient he has tried detox multiple times, he Pt reports drinking 10 Truly Hard Seltzers within the last 24 hours. He reports drinking a large amount daily for about a month. Pt is very tearful during triage process and reports recent life changes as his immediate stressors/triggers. Pt reports past substance abuse treatment at [facility] a few years ago.   Face-to-face observation of patient, patient is alert and oriented x 4, speech is clear, maintain eye contact.  Patient denies SI, HI, AVH or paranoia.  Patient denies prior suicide attempts.  Reports consuming two 12 pk beers on a daily,  Last time  Patient drink was prior to coming in.  Patient denies access to guns.  At this present moment patient does not seem to be influenced by internal stimuli.  Patient does not show any signs of withdrawal at this present moment.  According to patient he used to take Librium  for his alcohol when he was on the treatment.  But according to him he has not tried detox in over a year.  Given patient history and current presentation we will admit patient to [facility] unit and start detox protocol.  PHQ-9 completed patient scored a 17  Recommend [facility] unit  PHQ 2-9:  Flowsheet Row ED from 07/25/2024 in [facility] Office Visit from 10/20/2023 in [facility] at [facility] Office Visit from 04/22/2023 in [facility] at [facility]  Thoughts that you would be better off dead, or of hurting yourself in some way Several days Not at all Not at all  PHQ-9 Total Score 17 6 5     Flowsheet Row ED from 07/25/2024 in [facility] Most recent reading at 07/26/2024 12:39 AM ED from 07/25/2024 in [facility] Most recent reading at 07/25/2024 10:23 PM UC from 05/05/2024 in [facility] at [facility]) Most recent reading at 05/05/2024  2:24 PM  C-SSRS RISK CATEGORY Low Risk Low Risk No Risk      Total Time spent with patient: 45 minutes  Musculoskeletal  Strength & Muscle Tone: within normal limits Gait & Station: normal Patient leans: N/A  Psychiatric Specialty Exam  Presentation General Appearance: Casual  Eye Contact:Fair  Speech:Clear and Coherent  Speech Volume:Normal  Handedness:Right   Mood and Affect  Mood:Anxious  Affect:Congruent   Thought Process  Thought Processes:Coherent  Descriptions of Associations:Intact  Orientation:Full (Time, Place and Person)  Thought Content:WDL  Diagnosis of Schizophrenia or Schizoaffective disorder in past: No   Hallucinations:Hallucinations: None  Ideas of Reference:None  Suicidal Thoughts:Suicidal Thoughts: No  Homicidal Thoughts:Homicidal Thoughts: No   Sensorium  Memory:Immediate Fair  Judgment:Poor  Insight:Fair   Executive Functions  Concentration:Fair  Attention Span:Fair  Recall:Fair  Fund of Knowledge:Fair  Language:Fair   Psychomotor Activity  Psychomotor Activity:Psychomotor Activity: Normal   Assets  Assets:Desire for Improvement   Sleep  Sleep:Sleep: Fair Number of Hours of Sleep: 4   Nutritional Assessment (For OBS and FBC admissions  only) Has the patient had a weight loss or gain of 10 pounds or more in the last 3 months?: No Has the patient had a decrease in food intake/or appetite?: No Does the patient have dental problems?: No Does the patient have eating habits or behaviors that may be indicators of an eating disorder including binging or inducing vomiting?: No Has the patient recently lost weight without trying?: 0 Has the patient been eating poorly because of a decreased appetite?: 0 Malnutrition Screening Tool Score: 0    Physical Exam HENT:     Head: Normocephalic.     Nose: Nose normal.  Eyes:     Pupils: Pupils are equal, round, and reactive to light.  Cardiovascular:     Rate and Rhythm: Normal rate.  Pulmonary:     Effort: Pulmonary effort is normal.  Musculoskeletal:        General: Normal range of motion.     Cervical back: Normal range of motion.  Neurological:     General: No focal deficit present.     Mental Status: He is alert.  Psychiatric:        Mood and Affect: Mood normal.        Behavior: Behavior normal.        Thought Content: Thought content normal.        Judgment: Judgment normal.    Review of Systems  Constitutional: Negative.   HENT: Negative.    Eyes: Negative.   Respiratory: Negative.    Cardiovascular: Negative.   Gastrointestinal: Negative.   Genitourinary: Negative.   Musculoskeletal: Negative.   Skin: Negative.   Neurological: Negative.   Psychiatric/Behavioral:  Positive for depression and substance abuse. The patient is nervous/anxious.     Blood pressure (!) 140/89, pulse 96, temperature 97.6 F (36.4 C), temperature source Oral, resp. rate 18, SpO2 96%. There is no height or weight on file to calculate BMI.  Past Psychiatric History: Alcohol abuse  Is the patient at risk to self? No  Has the patient been a risk to self in the past 6 months? No .    Has the patient been a risk to self within the distant past? No   Is the patient a risk to others? No    Has the patient been a risk to others in the past 6 months? No   Has the patient been a risk to others within the distant past? No   Past Medical History: See chart Family History: Unknown Social History: Alcohol, tobacco use  Last Labs:  Admission on 07/25/2024  Component Date Value Ref Range Status   WBC 07/25/2024 6.2  4.0 - 10.5 K/uL Final   RBC 07/25/2024 4.97  4.22 - 5.81 MIL/uL Final   Hemoglobin 07/25/2024 15.7  13.0 - 17.0 g/dL Final   HCT 98/93/7973 42.6  39.0 - 52.0 % Final   MCV 07/25/2024 85.7  80.0 - 100.0 fL Final   MCH 07/25/2024 31.6  26.0 - 34.0 pg Final   MCHC 07/25/2024 36.9 (H)  30.0 - 36.0 g/dL Final   RDW 98/93/7973 13.8  11.5 - 15.5 % Final   Platelets 07/25/2024 156  150 - 400 K/uL Final   nRBC 07/25/2024 0.0  0.0 - 0.2 % Final   Neutrophils Relative % 07/25/2024 46  % Final   Neutro Abs 07/25/2024 2.9  1.7 - 7.7 K/uL Final   Lymphocytes Relative 07/25/2024 45  % Final   Lymphs Abs 07/25/2024 2.8  0.7 - 4.0 K/uL Final   Monocytes Relative 07/25/2024 7  % Final   Monocytes Absolute 07/25/2024 0.4  0.1 - 1.0 K/uL Final   Eosinophils Relative 07/25/2024 1  % Final   Eosinophils Absolute 07/25/2024 0.1  0.0 - 0.5 K/uL Final   Basophils Relative 07/25/2024 1  % Final   Basophils Absolute 07/25/2024 0.0  0.0 - 0.1 K/uL Final   Immature Granulocytes 07/25/2024 0  % Final   Abs Immature Granulocytes 07/25/2024 0.02  0.00 - 0.07 K/uL Final   Performed at [facility], [address], [place], [place] [address]   Sodium 07/25/2024 137  135 - 145 mmol/L Final   Potassium 07/25/2024 4.1  3.5 - 5.1 mmol/L Final   Chloride 07/25/2024 96 (L)  98 - 111 mmol/L Final   CO2 07/25/2024 27  22 - 32 mmol/L Final   Glucose, Bld 07/25/2024 107 (H)  70 - 99 mg/dL Final   Glucose reference range applies only to samples taken after fasting for at least 8 hours.   BUN 07/25/2024 9  6 - 20 mg/dL Final   Creatinine, Ser 07/25/2024 0.98  0.61 - 1.24 mg/dL Final   Calcium  98/93/7973 9.0  8.9 - 10.3 mg/dL Final   Total Protein 98/93/7973 7.6  6.5 - 8.1 g/dL Final   Albumin 98/93/7973 4.8  3.5 - 5.0 g/dL Final   AST 98/93/7973 43 (H)  15 - 41 U/L Final   ALT 07/25/2024 45 (H)  0 - 44 U/L Final   Alkaline Phosphatase 07/25/2024 59  38 - 126 U/L Final   Total Bilirubin 07/25/2024 0.4  0.0 - 1.2 mg/dL Final   GFR, Estimated 07/25/2024 >60  >60 mL/min Final   Comment: (NOTE) Calculated using the CKD-EPI Creatinine Equation (2021)    Anion gap 07/25/2024 14  5 - 15 Final   Performed at [facility], [address] [place], [place] [address]   Alcohol, Ethyl (B) 07/25/2024 330 (HH)  <15 mg/dL Final   Comment: Critical Value, Read Back and verified with Linnie SQUIBB. RN at [phone] 07/26/24 SatrainR (NOTE) For medical purposes only. Performed at [facility], [address], [place], [place] [address]    TSH 07/25/2024 0.835  0.350 - 4.500 uIU/mL Final   Performed at [facility], [address] [place], [place] [address]   POC Amphetamine UR 07/25/2024 None Detected  NONE DETECTED (Cut Off Level 1000 ng/mL) Final   POC Secobarbital (BAR) 07/25/2024 None Detected  NONE DETECTED (Cut Off Level 300 ng/mL) Final   POC Buprenorphine (BUP) 07/25/2024 None Detected  NONE DETECTED (Cut Off Level 10 ng/mL) Final   POC Oxazepam (BZO) 07/25/2024 None Detected  NONE DETECTED (Cut Off Level 300 ng/mL) Final   POC Cocaine UR 07/25/2024 None Detected  NONE DETECTED (Cut Off Level 300 ng/mL) Final   POC Methamphetamine UR 07/25/2024 None Detected  NONE DETECTED (Cut Off Level 1000 ng/mL) Final   POC Morphine 07/25/2024 None Detected  NONE DETECTED (Cut Off Level 300 ng/mL) Final   POC Methadone UR 07/25/2024 None Detected  NONE DETECTED (Cut Off Level 300 ng/mL) Final   POC Oxycodone UR 07/25/2024 None Detected  NONE DETECTED (Cut Off Level  100 ng/mL) Final   POC Marijuana UR 07/25/2024 None Detected  NONE DETECTED (Cut Off Level 50 ng/mL) Final    Allergies:  Peanuts [peanut oil]  Medications:  Facility Ordered Medications  Medication   acetaminophen  (TYLENOL ) tablet 650 mg   alum & mag hydroxide-simeth (MAALOX/MYLANTA) 200-200-20 MG/5ML suspension 30 mL   magnesium  hydroxide (MILK OF MAGNESIA) suspension 30 mL   [COMPLETED] thiamine  (VITAMIN B1) injection 100 mg   multivitamin with minerals tablet 1 tablet   chlordiazePOXIDE  (LIBRIUM ) capsule 25 mg   hydrOXYzine  (ATARAX ) tablet 25 mg   loperamide  (IMODIUM ) capsule 2-4 mg   ondansetron  (ZOFRAN -ODT) disintegrating tablet 4 mg   haloperidol  (HALDOL ) tablet 5 mg   And   diphenhydrAMINE  (BENADRYL ) capsule 50 mg   haloperidol  lactate (HALDOL ) injection 5 mg   And   diphenhydrAMINE  (BENADRYL ) injection 50 mg   And   LORazepam  (ATIVAN ) injection 2 mg   haloperidol  lactate (HALDOL ) injection 10 mg   And   diphenhydrAMINE  (BENADRYL ) injection 50 mg   And   LORazepam  (ATIVAN ) injection 2 mg   PTA Medications  Medication Sig   EPINEPHrine  0.3 mg/0.3 mL IJ SOAJ injection Inject 0.3 mg into the muscle as needed for anaphylaxis. (Patient not taking: Reported on 10/20/2023)   fexofenadine  (ALLEGRA  ALLERGY) 180 MG tablet Take 1 tablet (180 mg total) by mouth daily.   fluticasone  (FLONASE ) 50 MCG/ACT nasal spray Place 2 sprays into both nostrils daily.   varenicline  (CHANTIX  CONTINUING MONTH PAK) 1 MG tablet Take 1 tablet (1 mg total) by mouth 2 (two) times daily. Start after 0.5 mg prescription   gabapentin  (NEURONTIN ) 300 MG capsule Take 1 capsule (300 mg total) by mouth 3 (three) times daily as needed.   chlordiazePOXIDE  (LIBRIUM ) 25 MG capsule 50mg  PO TID x 1D, then 25-50mg  PO BID X 1D, then 25-50mg  PO QD X 1D   hydrOXYzine  (ATARAX ) 25 MG tablet Take 1 tablet (25 mg total) by mouth every 6 (six) hours.    Long Term Goals: Improvement in symptoms so as ready for discharge  Short Term Goals: Patient will verbalize feelings in meetings with treatment team members., Patient will attend at least of  50% of the groups daily., Pt will complete the PHQ9 on admission, day 3 and discharge., Patient will participate in completing the [place] Suicide Severity Rating Scale, Patient will score a low risk of violence for 24 hours prior to discharge, and Patient will take medications as prescribed daily.  Medical Decision Making  [facility] unit     Recommendations  Based on my evaluation the patient does not appear to have an emergency medical condition.  Gaither Pouch, NP 07/26/2024  3:28 AM

## 2024-07-25 NOTE — Progress Notes (Signed)
" °   07/25/24 2154  [facility] Triage Screening (Walk-ins at [facility] only)  How Did You Hear About Us ? Self  What Is the Reason for Your Visit/Call Today? Pt presents to [facility] as a voluntary walk-in, accompanied by his family requesting substance abuse treatment and SI, without plan or intent. Pt reports drinking 10 Truly Hard Seltzers within the last 24 hours. He reports drinking a large amount daily for about a month. Pt is very tearful during triage process and reports recent life changes as his immediate stressors/triggers. Pt reports past substance abuse treatment at the [facility] a few years ago. Pt denies prior suicide attempts and self-injurious behaviors. Pt is able to contract for safety. Pt denies being established with outpatient therapist at this time. Pt currently denies HI,AVH.  Have You Recently Had Any Thoughts About Hurting Yourself? Yes  How long ago did you have thoughts about hurting yourself? currently, but denies plan or intent. States  I could never do it and reports having a partner and family to live for  Are You Planning to Commit Suicide/Harm Yourself At This time? No  Have you Recently Had Thoughts About Hurting Someone Sherral? No  Are You Planning To Harm Someone At This Time? No  Physical Abuse Denies  Verbal Abuse Denies  Sexual Abuse Denies  Exploitation of patient/patient's resources Denies  Self-Neglect Denies  Are you currently experiencing any auditory, visual or other hallucinations? No  Have You Used Any Alcohol or Drugs in the Past 24 Hours? Yes  What Did You Use and How Much? 10 Truly Hard Seltzers  Do you have any current medical co-morbidities that require immediate attention? No  Clinician description of patient physical appearance/behavior: cooperative, tearful at times, pleasant  What Do You Feel Would Help You the Most Today? Alcohol or Drug Use Treatment;Treatment for Depression or other mood problem  If access to [facility] was not available, would you  have sought care in the Emergency Department? Yes  Determination of Need Urgent (48 hours)  Options For Referral Other: Comment;Chemical Dependency Intensive Outpatient Therapy (CDIOP);Facility-Based Crisis;Outpatient Therapy  Determination of Need filed? Yes    "

## 2024-07-25 NOTE — Assessment & Plan Note (Signed)
 Ongoing issue with alcohol use disorder. Present for years. Last drink one hour ago. Discussed provider recommendation that he needs to go to [facility] in [place] for evaluation as he is a [facility] resident. Billy Johnson and his partner agree to go to [facility] now. Address provided.

## 2024-07-25 NOTE — Patient Instructions (Signed)
[facility] [address]  [place], [place]  No appointment necessary  Open 24/7.   

## 2024-07-26 DIAGNOSIS — F32A Depression, unspecified: Secondary | ICD-10-CM | POA: Diagnosis not present

## 2024-07-26 DIAGNOSIS — Z79899 Other long term (current) drug therapy: Secondary | ICD-10-CM | POA: Diagnosis not present

## 2024-07-26 DIAGNOSIS — I10 Essential (primary) hypertension: Secondary | ICD-10-CM | POA: Diagnosis not present

## 2024-07-26 DIAGNOSIS — F1010 Alcohol abuse, uncomplicated: Secondary | ICD-10-CM | POA: Diagnosis not present

## 2024-07-26 LAB — CBC WITH DIFFERENTIAL/PLATELET
Abs Immature Granulocytes: 0.02 K/uL (ref 0.00–0.07)
Basophils Absolute: 0 K/uL (ref 0.0–0.1)
Basophils Relative: 1 %
Eosinophils Absolute: 0.1 K/uL (ref 0.0–0.5)
Eosinophils Relative: 1 %
HCT: 42.6 % (ref 39.0–52.0)
Hemoglobin: 15.7 g/dL (ref 13.0–17.0)
Immature Granulocytes: 0 %
Lymphocytes Relative: 45 %
Lymphs Abs: 2.8 K/uL (ref 0.7–4.0)
MCH: 31.6 pg (ref 26.0–34.0)
MCHC: 36.9 g/dL — ABNORMAL HIGH (ref 30.0–36.0)
MCV: 85.7 fL (ref 80.0–100.0)
Monocytes Absolute: 0.4 K/uL (ref 0.1–1.0)
Monocytes Relative: 7 %
Neutro Abs: 2.9 K/uL (ref 1.7–7.7)
Neutrophils Relative %: 46 %
Platelets: 156 K/uL (ref 150–400)
RBC: 4.97 MIL/uL (ref 4.22–5.81)
RDW: 13.8 % (ref 11.5–15.5)
WBC: 6.2 K/uL (ref 4.0–10.5)
nRBC: 0 % (ref 0.0–0.2)

## 2024-07-26 LAB — COMPREHENSIVE METABOLIC PANEL WITH GFR
ALT: 45 U/L — ABNORMAL HIGH (ref 0–44)
AST: 43 U/L — ABNORMAL HIGH (ref 15–41)
Albumin: 4.8 g/dL (ref 3.5–5.0)
Alkaline Phosphatase: 59 U/L (ref 38–126)
Anion gap: 14 (ref 5–15)
BUN: 9 mg/dL (ref 6–20)
CO2: 27 mmol/L (ref 22–32)
Calcium: 9 mg/dL (ref 8.9–10.3)
Chloride: 96 mmol/L — ABNORMAL LOW (ref 98–111)
Creatinine, Ser: 0.98 mg/dL (ref 0.61–1.24)
GFR, Estimated: >60 mL/min
Glucose, Bld: 107 mg/dL — ABNORMAL HIGH (ref 70–99)
Potassium: 4.1 mmol/L (ref 3.5–5.1)
Sodium: 137 mmol/L (ref 135–145)
Total Bilirubin: 0.4 mg/dL (ref 0.0–1.2)
Total Protein: 7.6 g/dL (ref 6.5–8.1)

## 2024-07-26 LAB — ETHANOL: Alcohol, Ethyl (B): 330 mg/dL

## 2024-07-26 LAB — TSH: TSH: 0.835 u[IU]/mL (ref 0.350–4.500)

## 2024-07-26 MED ORDER — ADULT MULTIVITAMIN W/MINERALS CH: 1.0000 | ORAL_TABLET | Freq: Every day | ORAL | Status: DC

## 2024-07-26 MED ORDER — CLONIDINE HCL 0.1 MG PO TABS
0.1000 mg | ORAL_TABLET | Freq: Two times a day (BID) | ORAL | Status: DC | PRN
  Administered 2024-07-26 – 2024-07-27 (×3): 0.1 mg via ORAL
  Filled 2024-07-26 (×3): qty 1

## 2024-07-26 MED ORDER — CHLORDIAZEPOXIDE HCL 25 MG PO CAPS: 25.0000 mg | ORAL_CAPSULE | Freq: Four times a day (QID) | ORAL | Status: DC | PRN

## 2024-07-26 MED ORDER — CHLORDIAZEPOXIDE HCL 25 MG PO CAPS: 25.0000 mg | ORAL_CAPSULE | Freq: Every day | ORAL | Status: DC

## 2024-07-26 MED ORDER — CHLORDIAZEPOXIDE HCL 25 MG PO CAPS
25.0000 mg | ORAL_CAPSULE | Freq: Four times a day (QID) | ORAL | Status: AC
  Administered 2024-07-26 (×4): 25 mg via ORAL
  Filled 2024-07-26 (×4): qty 1

## 2024-07-26 MED ORDER — CHLORDIAZEPOXIDE HCL 25 MG PO CAPS
25.0000 mg | ORAL_CAPSULE | Freq: Three times a day (TID) | ORAL | Status: AC
  Administered 2024-07-27 (×3): 25 mg via ORAL
  Filled 2024-07-26 (×3): qty 1

## 2024-07-26 MED ORDER — THIAMINE HCL 100 MG/ML IJ SOLN
100.0000 mg | Freq: Once | INTRAMUSCULAR | Status: AC
  Administered 2024-07-26: 100 mg via INTRAMUSCULAR
  Filled 2024-07-26: qty 2

## 2024-07-26 MED ORDER — ONDANSETRON 4 MG PO TBDP: 4.0000 mg | ORAL_TABLET | Freq: Four times a day (QID) | ORAL | Status: DC | PRN

## 2024-07-26 MED ORDER — CHLORDIAZEPOXIDE HCL 25 MG PO CAPS
25.0000 mg | ORAL_CAPSULE | ORAL | Status: DC
  Administered 2024-07-28: 25 mg via ORAL
  Filled 2024-07-26 (×2): qty 1

## 2024-07-26 NOTE — ED Notes (Signed)
 Pt sleeping at present, no distress noted.  Monitoring for safety.

## 2024-07-26 NOTE — Care Management (Addendum)
 [facility] Care Management...   Adendum 3:22 pm  Patient requested SAIOP in [facility] area, referral sent to [facility]  Addendum 1:01 pm...  Per Darice, they are no longer in network with Medicaid [employer]. Patient will not be able to attend [facility] CD-IOP  Per Elsie Prim he can see patient on an individual basis at [facility] met with patient to inform of status. Patient is in agreement to see Elsie at [facility] address  Clinical Research Associate is waiting to hear response from scheduling for date and time  Writer met with patient to discuss discharge planning  Patient reported previously attending SA-IOP at [facility]. Patient reported possibly needing something a little more structured and intense. Patient declined inpatient treatment. Patient reported no pending charges or court dates.  Patient interested in CD-IOP  Writer sent secure chat to [facility] CD-IOP for patient eligibility, scheduling

## 2024-07-26 NOTE — ED Notes (Signed)
Pt A&O x 4, no distress noted., calm & cooperative, monitoring for safety. 

## 2024-07-26 NOTE — Group Note (Signed)
 Group Topic: Balance in Life  Group Date: 07/26/2024 Start Time: 1300 End Time: 1325 Facilitators: Deidre Prentis CROME, NT  Department: [facility]  Number of Participants: 2  Group Focus: chemical dependency issues Treatment Modality:  Psychoeducation Interventions utilized were support Purpose: reinforce self-care  Name: Billy Johnson Date of Birth: 1991/04/14  MR: [id number]    Level of Participation: active Quality of Participation: attentive Interactions with others: gave feedback Mood/Affect: appropriate Triggers (if applicable):  N/A Cognition: coherent/clear Progress: Gaining insight Response: pt identified healthier coping strategies.  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

## 2024-07-26 NOTE — Group Note (Signed)
 Group Topic: Understanding Self  Group Date: 07/26/2024 Start Time: 1200 End Time: 1230 Facilitators: Daved Tinnie HERO, RN  Department: [facility]  Number of Participants: 5  Group Focus: feeling awareness/expression Treatment Modality:  Psychoeducation Interventions utilized were patient education Purpose: increase insight  Name: Billy Johnson Date of Birth: 1991-01-02  MR: [id number]    Level of Participation: moderate Quality of Participation: cooperative Interactions with others: gave feedback Mood/Affect: appropriate Triggers (if applicable): n/a Cognition: coherent/clear Progress: Gaining insight Response: to help manage emotions, pt says they will improve communication  Plan: patient will be encouraged to attend future RN education groups  Patients Problems:  Patient Active Problem List   Diagnosis Date Noted   Alcohol use disorder, severe, dependence (HCC) 07/25/2024   Alcohol abuse 07/25/2024   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

## 2024-07-26 NOTE — ED Notes (Signed)
 P/C received from Lab to offer results for  this Pts.  Blood Etoh level = 330 .  Spoke with Roider.

## 2024-07-26 NOTE — ED Notes (Signed)
 Pt present as a voluntary walk-in to [facility] requesting alcohol detox. Patient reports history of alcohol abuse for 10 years but has been a heavy drinker for the past one month. Pt reports trying detox at home before but reports wanting medical detox this time. Pt is tearful. Admission process completed. Medication administered and meal provided.

## 2024-07-26 NOTE — ED Notes (Signed)
 Pt presents as pleasant, but anxious. Pt c/o anxiety and withdrawal symptoms including cold sweats and a pain on his R side that comes and goes. Pain was not present at time of RN assessment. Pt denies si hi and avh, verbal contract for safety provided. Medications reviewed, questions denied, pt agreed to prn atarax  for anxiety.

## 2024-07-26 NOTE — ED Notes (Signed)
 Pt ate lunch. Pt continues to presents as cooperative despite withdrawal symptoms, medications reviewed. Pt currently attending MHT group in dayroom. Pt provided with hygiene supplies to take a shower as requested.

## 2024-07-27 DIAGNOSIS — I10 Essential (primary) hypertension: Secondary | ICD-10-CM | POA: Diagnosis not present

## 2024-07-27 DIAGNOSIS — F1010 Alcohol abuse, uncomplicated: Secondary | ICD-10-CM | POA: Diagnosis not present

## 2024-07-27 DIAGNOSIS — F32A Depression, unspecified: Secondary | ICD-10-CM | POA: Diagnosis not present

## 2024-07-27 DIAGNOSIS — Z79899 Other long term (current) drug therapy: Secondary | ICD-10-CM | POA: Diagnosis not present

## 2024-07-27 MED ORDER — AMLODIPINE BESYLATE 5 MG PO TABS
5.0000 mg | ORAL_TABLET | Freq: Every day | ORAL | Status: DC
  Administered 2024-07-27 – 2024-07-28 (×2): 5 mg via ORAL
  Filled 2024-07-27 (×2): qty 1

## 2024-07-27 NOTE — ED Notes (Signed)
 Pt sitting in dayroom watching television. No acute distress noted. No concerns voiced. Informed pt to notify staff with any needs or assistance. Pt verbalized understanding and agreement. Will continue to monitor for safety.

## 2024-07-27 NOTE — Group Note (Signed)
 Group Topic: Communication  Group Date: 07/27/2024 Start Time: 1705 End Time: 1730 Facilitators: Herold Lajuana NOVAK, RN  Department: [facility]  Number of Participants: 10  Group Focus: other unit rules  Treatment Modality:  Individual Therapy Interventions utilized were clarification Purpose: increase insight  Name: Billy Johnson Date of Birth: Aug 21, 1990  MR: [id number]    Level of Participation: active Quality of Participation: attentive Interactions with others: gave feedback Mood/Affect: appropriate Triggers (if applicable): none identified Cognition: coherent/clear Progress: Gaining insight Response: Pt verbalized understanding of rules of unit Plan: patient will be encouraged to remain tx compliant to rules of unit  Patients Problems:  Patient Active Problem List   Diagnosis Date Noted   Alcohol use disorder, severe, dependence (HCC) 07/25/2024   Alcohol abuse 07/25/2024   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

## 2024-07-27 NOTE — Group Note (Signed)
 Group Topic: Recovery Basics  Group Date: 07/27/2024 Start Time: 8069 End Time: 2000 Facilitators: Verdon Jacqualyn BRAVO, NT  Department: [facility]  Number of Participants: 8  Group Focus: relapse prevention Treatment Modality:  Individual Therapy Interventions utilized were group exercise Purpose: relapse prevention strategies  Name: Billy Johnson Date of Birth: Nov 15, 1990  MR: [id number]    Level of Participation: active Quality of Participation: cooperative Interactions with others: gave feedback Mood/Affect: appropriate Triggers (if applicable): n/a Cognition: coherent/clear Progress: Moderate Response: n/a Plan: follow-up needed  Patients Problems:  Patient Active Problem List   Diagnosis Date Noted   Alcohol use disorder, severe, dependence (HCC) 07/25/2024   Alcohol abuse 07/25/2024   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

## 2024-07-27 NOTE — ED Notes (Signed)
 Patient A&Ox4. Denies intent to harm self/others when asked. Denies A/VH. Patient denies any physical complaints when asked. Pt with elevated BP reading this am 160/112. Provider made aware and pt received Clonidine  0.1mg  per MD orders. Pt tolerated well. Support and encouragement provided. Routine safety checks conducted according to facility protocol. Encouraged patient to notify staff if thoughts of harm toward self or others arise. Patient verbalize understanding and agreement. Will continue to monitor for safety.

## 2024-07-27 NOTE — ED Provider Notes (Signed)
 Behavioral Health Progress Note  Date and Time: 07/27/2024 3:50 PM Name: Billy Johnson MRN:  [id number]  Subjective:  Billy Johnson, 34 y.o., male  presented to [facility] voluntarily and was accepted to [facility] for alcohol detox on 07/26/2023. Patient seen face to face by this provider, and chart reviewed on 07/27/2024.  Per chart review, patient has a past psychiatric history of depression, ADD and alcohol use disorder.  Pertinent medical history includes hypertension and history of avascular necrosis of the hip.  Upon admission patient BAL was 0.330 and UDS was negative.  On evaluation Billy Johnson reports that he slept much better last night and has had a good appetite.  He continues to deny suicidal ideations, homicidal ideations and psychotic symptoms.  Patient denies having any current withdrawal symptoms and decreased cravings.  Patient reports a history of dealing with alcohol use disorder a couple of years ago and was able to remain sober up until recently.  Patient reports that when he is drinking alcohol, he tends to also be hypertensive and has previously been prescribed amlodipine  to treat this.  Patient states that he has not taken it in a while as he was not drinking, working out and eating healthier.  Explained that his blood pressure has been consistently elevated throughout his stay and patient agreed to restart amlodipine  at lower dose of 5 mg for hypertension.  Patient states that he originally wanted to start services and a SA IOP program in [place] however per social work, Copy does not take this type of insurance.  Discussed other outpatient options including attending AA meetings in [facility] and following up with an outpatient therapist that can treat both substance disorders and mental health.  Patient is agreeable to this plan and is hopeful to discharge tomorrow.  Patient denies any safety concerns at this time and feels that  with the help of his partner with whom he lives with, he will be able to remain sober.  Patient has previously trialed naltrexone  to help decrease cravings, will discuss this prior to discharge tomorrow.   During evaluation Billy Johnson is finishing up lunch, in no acute distress.  He is alert/oriented x 4; calm/cooperative; and mood congruent with affect.  He is speaking in a clear tone at moderate volume, and normal pace; with good eye contact.  His thought process is coherent and relevant; There is no objective indication that he is currently responding to internal/external stimuli or experiencing delusional thought content. He has denied suicidal ideation,self-harm, homicidal ideation, auditory hallucinations, visual hallucinations and/or paranoia. Patient has remained calm throughout assessment and has answered questions appropriately.      Diagnosis:  Final diagnoses:  Alcohol abuse  Depression, unspecified depression type    Total Time spent with patient: 20 minutes  Past Psychiatric History: MDD, ETOH use and ADD. Past Medical History:  Past Medical History:  Diagnosis Date   Asthma    Avascular necrosis (HCC)    ETOH abuse    H. pylori infection    Hypertension     Family History:  Family History  Problem Relation Age of Onset   Breast cancer Mother    Diabetes Neg Hx    Prostate cancer Neg Hx    Colon cancer Neg Hx     Family Psychiatric  History: None reported  Social History: Pt reports alcohol use and denies other substances. 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   Additional Social History:                         Sleep: Fair  Appetite:  Good  Current Medications:  Current Facility-Administered Medications  Medication Dose Route Frequency Provider Last Rate Last Admin   acetaminophen  (TYLENOL ) tablet 650 mg  650 mg Oral Q6H PRN Trudy Carwin, NP       alum & mag  hydroxide-simeth (MAALOX/MYLANTA) 200-200-20 MG/5ML suspension 30 mL  30 mL Oral Q4H PRN Trudy Carwin, NP       amLODipine  (NORVASC ) tablet 5 mg  5 mg Oral Daily Nicholette Dolson C, NP   5 mg at 07/27/24 1501   chlordiazePOXIDE  (LIBRIUM ) capsule 25 mg  25 mg Oral Q6H PRN Trudy Carwin, NP       chlordiazePOXIDE  (LIBRIUM ) capsule 25 mg  25 mg Oral Q6H PRN Tex Drilling, NP       chlordiazePOXIDE  (LIBRIUM ) capsule 25 mg  25 mg Oral TID Nkwenti, Doris, NP   25 mg at 07/27/24 9146   Followed by   NOREEN ON 07/28/2024] chlordiazePOXIDE  (LIBRIUM ) capsule 25 mg  25 mg Oral BH-qamhs Nkwenti, Doris, NP       Followed by   NOREEN ON 07/29/2024] chlordiazePOXIDE  (LIBRIUM ) capsule 25 mg  25 mg Oral Daily Nkwenti, Drilling, NP       haloperidol  (HALDOL ) tablet 5 mg  5 mg Oral TID PRN Trudy Carwin, NP       And   diphenhydrAMINE  (BENADRYL ) capsule 50 mg  50 mg Oral TID PRN Trudy Carwin, NP       haloperidol  lactate (HALDOL ) injection 5 mg  5 mg Intramuscular TID PRN Trudy Carwin, NP       And   diphenhydrAMINE  (BENADRYL ) injection 50 mg  50 mg Intramuscular TID PRN Trudy Carwin, NP       And   LORazepam  (ATIVAN ) injection 2 mg  2 mg Intramuscular TID PRN Trudy Carwin, NP       haloperidol  lactate (HALDOL ) injection 10 mg  10 mg Intramuscular TID PRN Trudy Carwin, NP       And   diphenhydrAMINE  (BENADRYL ) injection 50 mg  50 mg Intramuscular TID PRN Trudy Carwin, NP       And   LORazepam  (ATIVAN ) injection 2 mg  2 mg Intramuscular TID PRN Trudy Carwin, NP       hydrOXYzine  (ATARAX ) tablet 25 mg  25 mg Oral Q6H PRN Trudy Carwin, NP   25 mg at 07/26/24 2222   loperamide  (IMODIUM ) capsule 2-4 mg  2-4 mg Oral PRN Trudy Carwin, NP       magnesium  hydroxide (MILK OF MAGNESIA) suspension 30 mL  30 mL Oral Daily PRN Trudy Carwin, NP       multivitamin with minerals tablet 1 tablet  1 tablet Oral Daily Trudy Carwin, NP   1 tablet at 07/27/24 9147   ondansetron  (ZOFRAN -ODT) disintegrating tablet 4 mg  4 mg  Oral Q6H PRN Tex Drilling, NP       No current outpatient medications on file.    Labs  Lab Results:  Admission on 07/25/2024  Component Date Value Ref Range Status   WBC 07/25/2024 6.2  4.0 - 10.5 K/uL Final   RBC 07/25/2024 4.97  4.22 - 5.81 MIL/uL Final   Hemoglobin 07/25/2024 15.7  13.0 - 17.0 g/dL Final   HCT 98/93/7973 42.6  39.0 - 52.0 % Final  MCV 07/25/2024 85.7  80.0 - 100.0 fL Final   MCH 07/25/2024 31.6  26.0 - 34.0 pg Final   MCHC 07/25/2024 36.9 (H)  30.0 - 36.0 g/dL Final   RDW 98/93/7973 13.8  11.5 - 15.5 % Final   Platelets 07/25/2024 156  150 - 400 K/uL Final   nRBC 07/25/2024 0.0  0.0 - 0.2 % Final   Neutrophils Relative % 07/25/2024 46  % Final   Neutro Abs 07/25/2024 2.9  1.7 - 7.7 K/uL Final   Lymphocytes Relative 07/25/2024 45  % Final   Lymphs Abs 07/25/2024 2.8  0.7 - 4.0 K/uL Final   Monocytes Relative 07/25/2024 7  % Final   Monocytes Absolute 07/25/2024 0.4  0.1 - 1.0 K/uL Final   Eosinophils Relative 07/25/2024 1  % Final   Eosinophils Absolute 07/25/2024 0.1  0.0 - 0.5 K/uL Final   Basophils Relative 07/25/2024 1  % Final   Basophils Absolute 07/25/2024 0.0  0.0 - 0.1 K/uL Final   Immature Granulocytes 07/25/2024 0  % Final   Abs Immature Granulocytes 07/25/2024 0.02  0.00 - 0.07 K/uL Final   Performed at [facility], [address], [place], [place] [address]   Sodium 07/25/2024 137  135 - 145 mmol/L Final   Potassium 07/25/2024 4.1  3.5 - 5.1 mmol/L Final   Chloride 07/25/2024 96 (L)  98 - 111 mmol/L Final   CO2 07/25/2024 27  22 - 32 mmol/L Final   Glucose, Bld 07/25/2024 107 (H)  70 - 99 mg/dL Final   Glucose reference range applies only to samples taken after fasting for at least 8 hours.   BUN 07/25/2024 9  6 - 20 mg/dL Final   Creatinine, Ser 07/25/2024 0.98  0.61 - 1.24 mg/dL Final   Calcium 98/93/7973 9.0  8.9 - 10.3 mg/dL Final   Total Protein 98/93/7973 7.6  6.5 - 8.1 g/dL Final   Albumin 98/93/7973 4.8  3.5 - 5.0 g/dL Final    AST 98/93/7973 43 (H)  15 - 41 U/L Final   ALT 07/25/2024 45 (H)  0 - 44 U/L Final   Alkaline Phosphatase 07/25/2024 59  38 - 126 U/L Final   Total Bilirubin 07/25/2024 0.4  0.0 - 1.2 mg/dL Final   GFR, Estimated 07/25/2024 >60  >60 mL/min Final   Comment: (NOTE) Calculated using the CKD-EPI Creatinine Equation (2021)    Anion gap 07/25/2024 14  5 - 15 Final   Performed at [facility], [address] [place], [place] [address]   Alcohol, Ethyl (B) 07/25/2024 330 (HH)  <15 mg/dL Final   Comment: Critical Value, Read Back and verified with Linnie SQUIBB. RN at [phone] 07/26/24 SatrainR (NOTE) For medical purposes only. Performed at [facility], [address] [place], [place] [address]    TSH 07/25/2024 0.835  0.350 - 4.500 uIU/mL Final   Performed at [facility], [address] [place], [place] [address]   POC Amphetamine UR 07/25/2024 None Detected  NONE DETECTED (Cut Off Level 1000 ng/mL) Final   POC Secobarbital (BAR) 07/25/2024 None Detected  NONE DETECTED (Cut Off Level 300 ng/mL) Final   POC Buprenorphine (BUP) 07/25/2024 None Detected  NONE DETECTED (Cut Off Level 10 ng/mL) Final   POC Oxazepam (BZO) 07/25/2024 None Detected  NONE DETECTED (Cut Off Level 300 ng/mL) Final   POC Cocaine UR 07/25/2024 None Detected  NONE DETECTED (Cut Off Level 300 ng/mL) Final   POC Methamphetamine  UR 07/25/2024 None Detected  NONE DETECTED (Cut Off Level 1000 ng/mL) Final   POC Morphine 07/25/2024 None Detected  NONE DETECTED (Cut Off Level 300 ng/mL) Final   POC Methadone UR 07/25/2024 None Detected  NONE DETECTED (Cut Off Level 300 ng/mL) Final   POC Oxycodone UR 07/25/2024 None Detected  NONE DETECTED (Cut Off Level 100 ng/mL) Final   POC Marijuana UR 07/25/2024 None Detected  NONE DETECTED (Cut Off Level 50 ng/mL) Final    Blood Alcohol level:  Lab Results  Component Value Date   ETH 330 (HH) 07/25/2024   ETH 299 (H) 10/01/2023    Metabolic Disorder Labs: Lab Results   Component Value Date   HGBA1C 5.6 10/20/2023   No results found for: PROLACTIN Lab Results  Component Value Date   CHOL 204 (H) 06/01/2022   TRIG 142 06/01/2022   HDL 67 06/01/2022   CHOLHDL 3.0 06/01/2022   LDLCALC 112 (H) 06/01/2022   LDLCALC 131 (H) 04/30/2020    Therapeutic Lab Levels: No results found for: LITHIUM No results found for: VALPROATE No results found for: CBMZ  Physical Findings   AUDIT    Flowsheet Row Office Visit from 07/25/2024 in [facility] at [facility]  Alcohol Use Disorder Identification Test Final Score (AUDIT) 24    GAD-7    Flowsheet Row Office Visit from 10/20/2023 in [facility] Office Visit from 04/22/2023 in [facility] at [facility] Office Visit from 07/18/2021 in [facility] [place] [facility] Of [facility] Office Visit from 06/07/2020 in [facility] [place] - [facility] Dept Of [facility]. [facility]  Total GAD-7 Score 9 9 0 5   PHQ2-9    Flowsheet Row ED from 07/25/2024 in [facility] Office Visit from 10/20/2023 in [facility] at [facility] Office Visit from 04/22/2023 in [facility] at [facility] Office Visit from 06/01/2022 in [facility] Office Visit from 07/18/2021 in [facility] [place] [facility]  PHQ-2 Total Score 3 2 2 3 1   PHQ-9 Total Score 17 6 5 8 3    Flowsheet Row ED from 07/25/2024 in [facility] Most recent reading at 07/26/2024 12:39 AM ED from 07/25/2024 in [facility] Most recent reading at 07/25/2024 10:23 PM UC from 05/05/2024 in [facility] at [facility]) Most recent reading at 05/05/2024  2:24 PM  C-SSRS RISK  CATEGORY Low Risk Low Risk No Risk     Musculoskeletal  Strength & Muscle Tone: within normal limits Gait & Station: normal Patient leans: N/A  Psychiatric Specialty Exam  Presentation  General Appearance:  Appropriate for Environment  Eye Contact: Good  Speech: Clear and Coherent  Speech Volume: Normal  Handedness: Right   Mood and Affect  Mood: Dysphoric  Affect: Appropriate   Thought Process  Thought Processes: Coherent; Linear  Descriptions of Associations:Intact  Orientation:Full (Time, Place and Person)  Thought Content:WDL  Diagnosis of Schizophrenia or Schizoaffective disorder in past: No    Hallucinations:Hallucinations: None  Ideas of Reference:None  Suicidal Thoughts:Suicidal Thoughts: No  Homicidal Thoughts:Homicidal Thoughts: No   Sensorium  Memory: Immediate Good; Recent Good  Judgment: Fair  Insight: Fair  Executive Functions  Concentration: Good  Attention Span: Good  Recall: Good  Fund of Knowledge: Good  Language: Good   Psychomotor Activity  Psychomotor Activity: Psychomotor Activity: Normal   Assets  Assets: Desire for Improvement; Housing; Physical Health; Social Support; Manufacturing Systems Engineer; Financial Resources/Insurance; Intimacy; Resilience; Vocational/Educational   Sleep  Sleep: Sleep: Fair  Estimated Sleeping Duration (Last 24 Hours): 8.75-11.00 hours  No data recorded  Physical Exam  Physical Exam Vitals and nursing note reviewed.  Constitutional:      Appearance: Normal appearance.  HENT:     Head: Normocephalic.     Nose: Nose normal.  Eyes:     Extraocular Movements: Extraocular movements intact.  Cardiovascular:     Rate and Rhythm: Normal rate.  Pulmonary:     Effort: Pulmonary effort is normal.  Musculoskeletal:        General: Normal range of motion.     Cervical back: Normal range of motion.  Neurological:     General: No focal deficit present.     Mental  Status: He is alert and oriented to person, place, and time.  Psychiatric:        Behavior: Behavior normal.    Review of Systems  Constitutional: Negative.   HENT: Negative.    Eyes: Negative.   Respiratory: Negative.    Cardiovascular: Negative.   Gastrointestinal: Negative.   Genitourinary: Negative.   Musculoskeletal: Negative.   Neurological: Negative.   Endo/Heme/Allergies: Negative.   Psychiatric/Behavioral:  Positive for substance abuse.    Blood pressure (!) 140/89, pulse 89, temperature 98.7 F (37.1 C), temperature source Oral, resp. rate 17, SpO2 98%. There is no height or weight on file to calculate BMI.  Treatment Plan Summary: Daily contact with patient to assess and evaluate symptoms and progress in treatment and Medication management  Pt feels that he is doing well, denies having any withdrawal symptoms and feels that he will be ready to discharge tomorrow 07/28/24. Pt denies suicidal ideations, homicidal ideations and hallucinations. No hx of complicated withdrawal or seizures. Pt appropriate to discharge tomorrow.   - Per SW pt will follow up with AA/NA and find a sponsor. He will also follow up with outpatient therapy and outpatient substance abuse treatment .   -Restart Amlodipine  5mg  as discussed with Dr. Lawrnce. Pt has a hx of hypertension and BP has been consistently elevated. Previously prescribed Amlodipine  10mg  daily in 2023.  Billy JAYSON Mcardle, NP 07/27/2024 3:50 PM

## 2024-07-27 NOTE — Group Note (Signed)
 Date:  07/27/2024 Time:  3:12 PM  Group Topic/Focus:  Building Self Esteem:   The Focus of this group is helping patients become aware of the effects of self-esteem on their lives, the things they and others do that enhance or undermine their self-esteem, seeing the relationship between their level of self-esteem and the choices they make and learning ways to enhance self-esteem. Conflict Resolution:   The focus of this group is to discuss the conflict resolution process and how it may be used upon discharge. Goals Group:   The focus of this group is to help patients establish daily goals to achieve during treatment and discuss how the patient can incorporate goal setting into their daily lives to aide in recovery. Self Care:   The focus of this group is to help patients understand the importance of self-care in order to improve or restore emotional, physical, spiritual, interpersonal, and financial health.    Participation Level:  Active  Participation Quality:  Attentive  Affect:  Appropriate  Cognitive:  Appropriate  Insight: Appropriate  Engagement in Group:  Engaged  Modes of Intervention:  Discussion  Additional Comments:  I was able to do an individual interview with patient. Patient was positive and open to thinking outside the box with future goals.   Vaudine Dutan T Sienna Stonehocker 07/27/2024, 3:12 PM

## 2024-07-27 NOTE — ED Notes (Signed)
 Pt sleeping at present, no distress noted.  Monitoring for safety.

## 2024-07-27 NOTE — ED Notes (Signed)
 Pt sitting in dayroom interacting with his visitor (mother). No acute distress noted. No concerns voiced. Informed pt to notify staff with any needs or assistance. Pt verbalized understanding and agreement. Will continue to monitor for safety.

## 2024-07-27 NOTE — ED Notes (Signed)
 Patient is in the Dayroom calm and composed, NAD. Respirations even and unlabored. Environment secured per policy. Will monitor for safety.

## 2024-07-27 NOTE — ED Notes (Addendum)
 Patient asleep at this time. NAD.  CIWA assessment could not be completed. Will keep monitoring patient for safety

## 2024-07-27 NOTE — ED Notes (Addendum)
Pt sleeping at present, no distress noted.  Monitoring for safety.,

## 2024-07-27 NOTE — Care Management (Addendum)
 [facility] Care Management...   Addendum 3:23 pm  Patient reported that he would like to find a Information Systems Manager provided patient with listing of [facility]  Patient will discharge Friday 07/28/24  [address]  [place]  [id number]   Patient will arrange transportation  30 day scripts   Addendum 1:57 pm  Per Emmie Caldron @ [facility] Patient has Medicaid through [employer] they will not be able to accept him   Writer met with patient to discuss discharge planning.  Writer informed patient that he is under review for [facility]   Patient requested to be connected with Information Systems Manager will provide patient with Therapist options

## 2024-07-27 NOTE — Group Note (Signed)
 Group Topic: Emotional Regulation  Group Date: 07/27/2024 Start Time: 9251 End Time: 2018 Facilitators: Bryan Saddie CROME, [place]  Department: [facility]  Number of Participants: 8  Group Focus: anger management Treatment Modality:  Psychoeducation Interventions utilized were group exercise Purpose: enhance coping skills and express feelings  Name: Donterrius Santucci Date of Birth: Jun 16, 1991  MR: [id number]    Level of Participation: active Quality of Participation: attentive and cooperative Interactions with others: gave feedback Mood/Affect: appropriate Triggers (if applicable): N/A Cognition: coherent/clear Progress: Moderate Response: N/A Plan: follow-up needed  Patients Problems:  Patient Active Problem List   Diagnosis Date Noted   Alcohol use disorder, severe, dependence (HCC) 07/25/2024   Alcohol abuse 07/25/2024   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

## 2024-07-27 NOTE — Discharge Instructions (Addendum)
 [facility] Care Management..  Patient will attend AA/NA meetings, find a sponsor, meeting information provided  Patient will connect with therapist, suggestions listed below   Patient requested out patient services  Patient will discharge to home 07/28/2024 by 11:00 am  [address]  [place] [address]   Patient will arrange transportation  30 day scripts  Base on the information you have provided and the presenting issue, outpatient services and resources for have been recommended.  It is imperative that you follow through with treatment recommendations within 5-7 days from the of discharge to mitigate further risk to your safety and mental well-being. A list of referrals has been provided below to get you started.  You are not limited to the list provided.  In case of an urgent crisis, you may contact the Mobile Crisis Unit with [facility] at [phone].                                                                                                                                                    Christian Therapists in [place], [place]     [facility] , Veterinary Surgeon in [place], [place] [facility] Counselor, CHARLES SCHWAB, CRC, [place], MBA, MS  [place], [place] [address]  My approach to mental health and co-occurring disorders includes Solution Focused Brief Therapy, Motivational Interviewing, Trauma Therapy (EMDR), CBT, and Christian Counseling. I specialize in mental health, trauma, substance abuse, and co-occurring disorders. I am Certified in EMDR which we use to process memories that may trigger a trauma response or the desire to use drugs. Some of our Trauma/PTSD processing has included Sexual Assault, Grief, Domestic Volience, In the line of Duty-Service Workers, TBI, and the loss of Employment. I work with clients with Depression, PTSD, Anxiety, Bipolar, and Schizophrenia. My approach is tailored to you and your specific needs. What I have learned  through life experiences is that overcoming these battles can be easier when you are not walking alone down the path. [phone]    Darryle Church, Counselor in [place], [place] [address], [place], [facility] Verified [place], [place] [address]  Additionally, I offer and integrate Christian/biblical based counseling therapy techniques if desired. I understand what it is like to take that first step towards therapy, and I am so glad you are here! At [facility], I count it a privilege to partner with you to assist in creating a powerful change in your life and help you reach the goals you desire. I work with individuals (6+), couples, and families on a range of life concerns, including anxiety, stress management, depression, conflicts with life, relationships and love, parental and family concerns, trauma, faith, spirituality, and so much more. Whatever you bring to the counseling room, we will work it out together. [phone]   Jasmyn L. Georgina, Marriage & Family Therapist in [place], [place] Jasmyn L. Georgina  Marriage & Family Therapist, MS, LMFT Verified [address] [place], [place] [address]  Additionally, my Sherlean faith is at the foundation of all I do, so I also offer clients an opportunity, if they choose, to bring their spirituality into the therapy space. I believe that we are all meant to be healed, restored, and whole in spirit, mind, and body. Many of life's hurts and challenges come through relationships and so does our healing. Therapy is a safe space to express emotions, receive guidance in making informed and healthy decisions, and move toward desired goals. I specialize in working with couples struggling with their relationship, communication, infidelity, sexual intimacy concerns, and overall connection. I also have a passion for working with individuals struggling with anxiety, depression, relational conflict, self-esteem/identity challenges, trauma & major life changes. [phone]      Lolita Mace, Licensed Clinical Mental Health Counselor in [place], [place] Lolita Mace Licensed Clinical Mental Health Counselor, [facility], [facility], NCC Verified [address] [place], [place] [address]  I am also happy to counsel from a Christian perspective but welcome clients from all belief backgrounds. Counseling is such a bold and brave step in the process of healing. I believe that healthy development and growth are not reached by the elimination of pain and challenges, but rather in developing healthy methods of understanding, coping with, and responding to pain. This can be a challenging, but a beautiful process, and I am honored to walk with my clients through it. Most of my training is in working with women and I enjoy working alongside them as they navigate working through trauma, merchandiser, retail, anxiety, boundary issues, guilt/shame, identity issues, life transitions, self-esteem, depression, and/or loss. [phone]    Paralee Cunning, Counselor in [place], [place] [address] Counselor, LCMHC-A Verified [place], [place] [address]  I hold a Masters degree in [employer] and utilize IFS, among other techniques. Do you feel weighed down by feelings of anxiety or plagued by intrusive thoughts that just won't leave you alone? Do you feel the need for everything to be perfect so that you can function or have successful relationships? If so, you belong here. I know how difficult it can be to ask for help but I also know that your story matters, that healing and wholeness is possible, and that you are not alone. [phone]    Harvest Bradford Casco, Licensed Clinical Mental Health Counselor in [place], [place] Marcella Bradford Casco Licensed Clinical Mental Health Counselor, [place], [facility], NCC Verified [address] [place], [place] [address]  I am able to offer counseling from a Christian perspective if a client so desires. I believe something very  powerful happens when a person gives themselves the space to slow down, connect, and be vulnerable. This can result in living more authentically and fully-whether life is smooth or you're surrounded by pain and suffering. I believe emotional healing is possible and desire to help facilitate clients' processes as much as possible. Together we will create and work towards positive, therapeutic goals. I'm grounded in person-centered counseling, which emphasizes empathy, authenticity, and acceptance. I also often utilize Acceptance and Commitment Therapy, CBT, and Psychodynamic Therapy. [phone]   Rosina Lunger, Lic Clinical Mental Health Counselor Associate in [place], [place] Rosina Lunger Lic Clinical Mental Health Counselor Associate, MS, [facility], LCASA [address] [place], [place] [address]  As a Librarian, academic, I'm open to integrating elements of faith into our sessions, but I will always have a profound respect for your individual beliefs. Life can be tough, am I  right? Sometimes we get to such a dark place that we dont recognize ourselves. You may feel frustrated, hopeless, and alone. I come from the perspective that through life's challenges (bad relationships, grief, trauma, or low self-esteem), we start to use unhealthy coping mechanisms such as drinking too much, isolating ourselves, or negative self-talk. Together we will delve into the underlying causes and conditions that contribute to unhealthy patterns and addictive behaviors. We will explore the factors that drive these behaviors and work toward creating positive change. [phone]    [facility] , Clinical Social Work/Therapist in [place], [place] [facility] Clinical Social Work/Therapist, MSW, LCSW, TF-CBT, CCTP [address] [place], [place] [address]  We offer Christian Therapy for [place] residents virtually so you can receive help conveniently. I'm okay.  Everything's alright. You tell yourself this repetitively while withholding tears and smiling for everyone else. You secretly struggle to believe what God says about you. Some days you feel so alone. You battle to feel loved and good enough to be the person God created you to be. Anxiety, depression, fear, grief, loss, shame and trauma have become emotional strongholds. Life seems to keep coming and you feel depleted. You ultimately want to live in freedom in your mind, no longer enslaved by the pain of the past, comparison, and even fear that your future will be failure. You desire to sense hope and fulfillment again. [phone]    Sid Nam, Pastoral Counselor in [place], [place] [facility] [address] [place], [place] [address]  As a Sherlean, I am confident that God is for you, and light can shine through the darkness. Are you wrestling with hard questions? Questions about grief, sexuality or gender, cultural identity, faith, or what's next? No matter what difficulties you are facing, there is hope and a path forward available. [phone]      Joy BIRCH. [facility], Clinical Social Work/Therapist in [place], [place] [facility] Clinical Social Work/Therapist, LCSW, LCSW-C Verified [place], [place] [address]  (Online Only) Welcome to my practice! I'm Denetra Gary, a Licensed Visual Merchandiser in [place]  (LCSW-C), [place]  (LCSW), and [place]  (LCSW). I'm a Holiday representative and use the Bible as my foundation. I have ten years of experience in the field of social work and mental health counseling. My therapeutic focus includes working with adults, teens, and children who struggle with anxiety, depression, and chronic disorganization. I'm confident and dedicated to helping you reach your goals. [phone]   [facility], Counselor in [place],  [place] [facility] Counselor, [place], [facility], LPC Verified [address] [place], [place] [address]  When it comes to counseling, [facility] takes a person-by-person approach. Every individual is unique. Each of us  comes to counseling for nuanced reasons. In order to best serve you, we will get to know each other and identify and address your specific needs. At [facility], theres no one-size-fits-all approach. We work to serve you best, exactly where and as you are. Specific care emphases include: family background as personal influence in decision- making, view of self, and initial relation to others; Christian insight drawn from Scripture with application to help you heal, grow in your spiritual life. [phone]  [facility]  [address] [place] [address] [phone] Sherlean based accepting [facility] insurance  [facility] [address] [place], [address] [place], [place] [address] [phone]  [facility], [facility] [address],  [place], [address] [phone]

## 2024-07-27 NOTE — Care Plan (Signed)
 Interdisciplinary Treatment and Diagnostic Plan Update  07/27/2024 Time of Session: 2pm Billy Johnson MRN: [id number]  Diagnosis:  Final diagnoses:  Alcohol abuse  Depression, unspecified depression type     Current Medications:  Current Facility-Administered Medications  Medication Dose Route Frequency Provider Last Rate Last Admin   acetaminophen  (TYLENOL ) tablet 650 mg  650 mg Oral Q6H PRN Trudy Carwin, NP       alum & mag hydroxide-simeth (MAALOX/MYLANTA) 200-200-20 MG/5ML suspension 30 mL  30 mL Oral Q4H PRN Trudy Carwin, NP       amLODipine  (NORVASC ) tablet 5 mg  5 mg Oral Daily Brent, Amanda C, NP   5 mg at 07/27/24 1501   chlordiazePOXIDE  (LIBRIUM ) capsule 25 mg  25 mg Oral Q6H PRN Trudy Carwin, NP       chlordiazePOXIDE  (LIBRIUM ) capsule 25 mg  25 mg Oral Q6H PRN Tex Drilling, NP       chlordiazePOXIDE  (LIBRIUM ) capsule 25 mg  25 mg Oral TID Nkwenti, Doris, NP   25 mg at 07/27/24 9146   Followed by   NOREEN ON 07/28/2024] chlordiazePOXIDE  (LIBRIUM ) capsule 25 mg  25 mg Oral BH-qamhs Nkwenti, Doris, NP       Followed by   NOREEN ON 07/29/2024] chlordiazePOXIDE  (LIBRIUM ) capsule 25 mg  25 mg Oral Daily Nkwenti, Drilling, NP       haloperidol  (HALDOL ) tablet 5 mg  5 mg Oral TID PRN Trudy Carwin, NP       And   diphenhydrAMINE  (BENADRYL ) capsule 50 mg  50 mg Oral TID PRN Trudy Carwin, NP       haloperidol  lactate (HALDOL ) injection 5 mg  5 mg Intramuscular TID PRN Trudy Carwin, NP       And   diphenhydrAMINE  (BENADRYL ) injection 50 mg  50 mg Intramuscular TID PRN Trudy Carwin, NP       And   LORazepam  (ATIVAN ) injection 2 mg  2 mg Intramuscular TID PRN Trudy Carwin, NP       haloperidol  lactate (HALDOL ) injection 10 mg  10 mg Intramuscular TID PRN Trudy Carwin, NP       And   diphenhydrAMINE  (BENADRYL ) injection 50 mg  50 mg Intramuscular TID PRN Trudy Carwin, NP       And   LORazepam  (ATIVAN ) injection 2 mg  2 mg Intramuscular TID PRN Trudy Carwin, NP        hydrOXYzine  (ATARAX ) tablet 25 mg  25 mg Oral Q6H PRN Trudy Carwin, NP   25 mg at 07/26/24 2222   loperamide  (IMODIUM ) capsule 2-4 mg  2-4 mg Oral PRN Trudy Carwin, NP       magnesium  hydroxide (MILK OF MAGNESIA) suspension 30 mL  30 mL Oral Daily PRN Trudy Carwin, NP       multivitamin with minerals tablet 1 tablet  1 tablet Oral Daily Trudy Carwin, NP   1 tablet at 07/27/24 9147   ondansetron  (ZOFRAN -ODT) disintegrating tablet 4 mg  4 mg Oral Q6H PRN Tex Drilling, NP       No current outpatient medications on file.   PTA Medications: Prior to Admission medications  Not on File    Patient Stressors: Medication change or noncompliance   Substance abuse    Patient Strengths: Printmaker for treatment/growth  Supportive family/friends   Treatment Modalities: Medication Management, Group therapy, Case management,  1 to 1 session with clinician, Psychoeducation, Recreational therapy.   Physician Treatment Plan for Primary and Secondary Diagnosis:  Final diagnoses:  Alcohol abuse  Depression, unspecified depression type   Long Term Goal(s): Improvement in symptoms so as ready for discharge  Short Term Goals: Patient will verbalize feelings in meetings with treatment team members. Patient will attend at least of 50% of the groups daily. Pt will complete the PHQ9 on admission, day 3 and discharge. Patient will participate in completing the [place] Suicide Severity Rating Scale Patient will score a low risk of violence for 24 hours prior to discharge Patient will take medications as prescribed daily.  Medication Management: Evaluate patient's response, side effects, and tolerance of medication regimen.  Therapeutic Interventions: 1 to 1 sessions, Unit Group sessions and Medication administration.  Evaluation of Outcomes: Progressing  LCSW Treatment Plan for Primary Diagnosis:  Final diagnoses:  Alcohol abuse  Depression, unspecified depression type     Long Term Goal(s): Safe transition to appropriate next level of care at discharge.  Short Term Goals: Facilitate acceptance of mental health diagnosis and concerns through verbal commitment to aftercare plan and appointments at discharge.  Therapeutic Interventions: Assess for all discharge needs, 1 to 1 time with Child psychotherapist, Explore available resources and support systems, Assess for adequacy in community support network, Educate family and significant other(s) on suicide prevention, Complete Psychosocial Assessment, Interpersonal group therapy.  Evaluation of Outcomes: Progressing   Progress in Treatment: Attending groups: Yes. Participating in groups: Yes. Taking medication as prescribed: Yes. Toleration medication: Yes.  Client is on a librium  taper.  Client scored a 0 on CIWA today. Family/Significant other contact made: Yes, individual(s) contacted:  Partner & mother, Devere. Patient understands diagnosis: Yes. Discussing patient identified problems/goals with staff: Yes. Medical problems stabilized or resolved: Yes. Denies suicidal/homicidal ideation: Yes. Issues/concerns per patient self-inventory: No. Other: Client reports no SI/HI and or plans.  Client reports no AVH.  Client has been pleasant and taking medications as prescribed.  Client is scheduled for discharge tomorrow.  Client was given therapist options for treatment.   New problem(s) identified: No, Describe:  NA  New Short Term/Long Term Goal(s): Client reports he will stay at [facility] to stabilize his medications and to be connected with therapist.  Client reports no withdrawal symptoms.   Patient Goals:  Client reports him primary goal to be discharged home tomorrow after completing his detox.  Client reports he will connect with a therapist and arrange medication management appointment.  Discharge Plan or Barriers: Client reports his family/partner will pick him up tomorrow from [facility].   Reason for Continuation of  Hospitalization: Medication stabilization Withdrawal symptoms  Estimated Length of Stay: 07/26/24 -29/26  Last 3 Columbia Suicide Severity Risk Score: Flowsheet Row ED from 07/25/2024 in [facility] Most recent reading at 07/26/2024 12:39 AM ED from 07/25/2024 in [facility] Most recent reading at 07/25/2024 10:23 PM UC from 05/05/2024 in [facility]) Most recent reading at 05/05/2024  2:24 PM  C-SSRS RISK CATEGORY Low Risk Low Risk No Risk    Last PHQ 2/9 Scores:    07/25/2024   11:20 PM 10/20/2023    2:24 PM 04/22/2023    2:50 PM  Depression screen PHQ 2/9  Decreased Interest 1 1 1   Down, Depressed, Hopeless 2 1 1   PHQ - 2 Score 3 2 2   Altered sleeping 2 0 0  Tired, decreased energy 3 1 1   Change in appetite 2 1 1   Feeling bad or failure about yourself  3 1 1   Trouble concentrating 2  0 0  Moving slowly or fidgety/restless 1 1 0  Suicidal thoughts 1 0 0  PHQ-9 Score 17 6  5    Difficult doing work/chores  Somewhat difficult      Data saved with a previous flowsheet row definition    Scribe for Treatment Team: Pepper Kerrick, LCSW 07/27/2024 4:29 PM

## 2024-07-28 DIAGNOSIS — F1010 Alcohol abuse, uncomplicated: Secondary | ICD-10-CM | POA: Diagnosis not present

## 2024-07-28 DIAGNOSIS — I10 Essential (primary) hypertension: Secondary | ICD-10-CM | POA: Diagnosis not present

## 2024-07-28 DIAGNOSIS — F32A Depression, unspecified: Secondary | ICD-10-CM | POA: Diagnosis not present

## 2024-07-28 DIAGNOSIS — Z79899 Other long term (current) drug therapy: Secondary | ICD-10-CM | POA: Diagnosis not present

## 2024-07-28 MED ORDER — GABAPENTIN 100 MG PO CAPS: 100.0000 mg | ORAL_CAPSULE | Freq: Three times a day (TID) | ORAL | 0 refills | Status: AC

## 2024-07-28 MED ORDER — HYDRALAZINE HCL 25 MG PO TABS
25.0000 mg | ORAL_TABLET | Freq: Three times a day (TID) | ORAL | Status: DC | PRN
  Administered 2024-07-28: 25 mg via ORAL
  Filled 2024-07-28: qty 1

## 2024-07-28 MED ORDER — AMLODIPINE BESYLATE 5 MG PO TABS: 5.0000 mg | ORAL_TABLET | Freq: Every day | ORAL | 1 refills | Status: AC

## 2024-07-28 MED ORDER — HYDRALAZINE HCL 25 MG PO TABS: 25.0000 mg | ORAL_TABLET | Freq: Three times a day (TID) | ORAL | 0 refills | Status: AC | PRN

## 2024-07-28 MED ORDER — CHLORDIAZEPOXIDE HCL 25 MG PO CAPS: ORAL_CAPSULE | ORAL | 0 refills | Status: AC

## 2024-07-28 MED ORDER — CHLORDIAZEPOXIDE HCL 25 MG PO CAPS: 25.0000 mg | ORAL_CAPSULE | Freq: Four times a day (QID) | ORAL | 0 refills | Status: AC | PRN

## 2024-07-28 MED ORDER — HYDROXYZINE HCL 25 MG PO TABS: 25.0000 mg | ORAL_TABLET | Freq: Four times a day (QID) | ORAL | 0 refills | Status: AC | PRN

## 2024-07-28 NOTE — ED Notes (Signed)
 RN spoke with pt's partner jody B.' As requested, to provide an update about reason for delayed discharge/ elevated BP.

## 2024-07-28 NOTE — ED Notes (Signed)
 Discharge paperwork discussed with pt including na resources, prescription, and follow up care. Questions denied.

## 2024-07-28 NOTE — ED Notes (Signed)
 Pt escorted off unit by staff for discharge, items returned.

## 2024-07-28 NOTE — ED Provider Notes (Signed)
 Blood pressure improved from 160/122 to 144/98 following the morning dose of amlodipine  and hydralazine  25 mg PO. Patient advised to follow up with primary care for ongoing blood pressure management. Medically stable and appropriate for discharge home at this time.

## 2024-07-28 NOTE — ED Notes (Signed)
 Pt says he feels 'great' and is ready for discharge. Pt denies si hi and avh, verbal contract for safety provided. Pt denies physical pain and discomforts. Medications reviewed, questions denied.

## 2024-07-28 NOTE — Group Note (Signed)
 Group Topic: Understanding Self  Group Date: 07/27/2024 Start Time: 1000 End Time: 1050 Facilitators: Gerome Jolly, NT  Department: [facility]  Number of Participants: 5  Group Focus: acceptance and chemical dependency education NA IP #9 Self-acceptance   Treatment Modality:  Cognitive Behavioral Therapy, Psychoeducation, and Spiritual Interventions utilized were exploration Purpose: explore maladaptive thinking, relapse prevention strategies, and to gain understanding of the lack of self-acceptance in recovery    Name: Billy Johnson Date of Birth: 06/05/1991  MR: [id number]    Level of Participation: active Quality of Participation: attentive, cooperative, and engaged Interactions with others: gave feedback Mood/Affect: appropriate and brightens with interaction Triggers (if applicable): na Cognition: coherent/clear and goal directed Progress: Moderate Response: patient actively encouraged his peers and provided feedback. 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

## 2024-07-28 NOTE — ED Provider Notes (Signed)
 FBC/OBS ASAP Discharge Summary  Date and Time: 07/28/2024 11:36 AM  Name: Billy Johnson  MRN:  [id number]   Discharge Diagnoses:  Final diagnoses:  Alcohol abuse  Depression, unspecified depression type    Subjective:  Billy Johnson, 34 y.o., male  presented to [facility] voluntarily and was accepted to [facility] for alcohol detox on 07/26/2023. Patient seen face to face by this provider, and chart reviewed on 07/27/2024.  Per chart review, patient has a past psychiatric history of depression, ADD and alcohol use disorder.  Pertinent medical history includes hypertension and history of avascular necrosis of the hip.  Upon admission patient BAL was 0.330 and UDS was negative.   Stay Summary:  During evaluation, [name] was finishing lunch and in no acute distress. He was alert and oriented 4, calm, cooperative, and demonstrated mood congruent with affect. Speech was clear, of normal rate, rhythm, and volume, with good eye contact. Thought processes were coherent, linear, and relevant. There was no objective evidence of response to internal or external stimuli and no delusional thought content noted. He denied suicidal ideation, self-harm, homicidal ideation, auditory hallucinations, visual hallucinations, and paranoia. He remained calm throughout the assessment and answered questions appropriately.  Earlier during todays assessment, the patient was observed lying in bed; upon approach, he sat upright and expressed a strong desire to enter outpatient rehabilitation.   The patient reported improved sleep, currently averaging 6-7 hours per night, and a fair appetite. He endorsed anxiety symptoms,however denies any physical symptoms  including intermittent chills and palpitations. He also denies dizziness, chest pain or shortness of breath. On the day of discharge, his blood pressure was noted to be elevated; hydralazine  was administered with improvement, and he  was advised to follow up with his primary care provider or a walk-in clinic for ongoing blood pressure management.  Throughout hospitalization, the patient has remained behaviorally appropriate and medically stable. He denied withdrawal symptoms and demonstrated some insight by initially seeking detoxification services. However, he became increasingly focused on discharge and ultimately expressed that he is no longer interested in completing detox or entering residential rehabilitation at this time. Motivational interviewing revealed that he would prefer to discharge today and is not currently committed to inpatient substance use treatment.  His mood has remained anxious, though he has consistently denied suicidal ideation, homicidal ideation, or auditory/visual hallucinations throughout his stay. He does not appear future-oriented with respect to substance use recovery and does not currently intend to pursue residential treatment.  Based on the Stages of Change Model, the patient is assessed to be in the action stage, recognizing the need for change and commitment to action. Risk factors include a history of substance use. These are mitigated by protective factors including no known access to weapons or firearms, the presence of a support system , his agreement with treatment recommendations, completion of a safety plan, and acknowledgment of the need for follow-up care.  The patient has chosen [facility] services near his home for outpatient medication management and has expressed understanding of and agreement with his medication regimen. He verbalized intent to remain medication compliant and is appropriate for discharge at this time.   Total Time spent with patient: 1 hour  Past Psychiatric History: Asthma, Etoh abuse, Hypertension Past Medical History: Asthma, HTN Family History: Mother breast cancer-Mother Family Psychiatric  History: None reported  Social History: Pt reports  alcohol use and denies other substances. 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  Tobacco Cessation:  N/A, patient does not currently use tobacco products  Current Medications:  Current Facility-Administered Medications  Medication Dose Route Frequency Provider Last Rate Last Admin   acetaminophen  (TYLENOL ) tablet 650 mg  650 mg Oral Q6H PRN Trudy Carwin, NP       alum & mag hydroxide-simeth (MAALOX/MYLANTA) 200-200-20 MG/5ML suspension 30 mL  30 mL Oral Q4H PRN Trudy Carwin, NP       amLODipine  (NORVASC ) tablet 5 mg  5 mg Oral Daily Brent, Amanda C, NP   5 mg at 07/28/24 9165   chlordiazePOXIDE  (LIBRIUM ) capsule 25 mg  25 mg Oral Q6H PRN Trudy Carwin, NP       chlordiazePOXIDE  (LIBRIUM ) capsule 25 mg  25 mg Oral Q6H PRN Tex Drilling, NP       chlordiazePOXIDE  (LIBRIUM ) capsule 25 mg  25 mg Oral Letha Tex, Doris, NP   25 mg at 07/28/24 9165   Followed by   NOREEN ON 07/29/2024] chlordiazePOXIDE  (LIBRIUM ) capsule 25 mg  25 mg Oral Daily Nkwenti, Drilling, NP       haloperidol  (HALDOL ) tablet 5 mg  5 mg Oral TID PRN Trudy Carwin, NP       And   diphenhydrAMINE  (BENADRYL ) capsule 50 mg  50 mg Oral TID PRN Trudy Carwin, NP       haloperidol  lactate (HALDOL ) injection 5 mg  5 mg Intramuscular TID PRN Trudy Carwin, NP       And   diphenhydrAMINE  (BENADRYL ) injection 50 mg  50 mg Intramuscular TID PRN Trudy Carwin, NP       And   LORazepam  (ATIVAN ) injection 2 mg  2 mg Intramuscular TID PRN Trudy Carwin, NP       haloperidol  lactate (HALDOL ) injection 10 mg  10 mg Intramuscular TID PRN Trudy Carwin, NP       And   diphenhydrAMINE  (BENADRYL ) injection 50 mg  50 mg Intramuscular TID PRN Trudy Carwin, NP       And   LORazepam  (ATIVAN ) injection 2 mg  2 mg Intramuscular TID PRN Trudy Carwin, NP       hydrALAZINE  (APRESOLINE ) tablet 25 mg  25 mg Oral Q8H PRN Wilkie Majel RAMAN, FNP   25 mg at 07/28/24 1135    hydrOXYzine  (ATARAX ) tablet 25 mg  25 mg Oral Q6H PRN Trudy Carwin, NP   25 mg at 07/28/24 1013   loperamide  (IMODIUM ) capsule 2-4 mg  2-4 mg Oral PRN Trudy Carwin, NP       magnesium  hydroxide (MILK OF MAGNESIA) suspension 30 mL  30 mL Oral Daily PRN Trudy Carwin, NP       multivitamin with minerals tablet 1 tablet  1 tablet Oral Daily Trudy Carwin, NP   1 tablet at 07/28/24 9166   ondansetron  (ZOFRAN -ODT) disintegrating tablet 4 mg  4 mg Oral Q6H PRN Tex Drilling, NP       Current Outpatient Medications  Medication Sig Dispense Refill   [START ON 07/29/2024] amLODipine  (NORVASC ) 5 MG tablet Take 1 tablet (5 mg total) by mouth daily. 30 tablet 1   chlordiazePOXIDE  (LIBRIUM ) 25 MG capsule Take 1 capsule (25 mg total) by mouth every 6 (six) hours as needed for withdrawal (CIWA score > 10). 30 capsule 0   chlordiazePOXIDE  (LIBRIUM ) 25 MG capsule Take 1 capsule (25 mg total) by mouth every 6 (six) hours as needed for withdrawal (CIWA score > 10). 30 capsule 0  chlordiazePOXIDE  (LIBRIUM ) 25 MG capsule Take 1 capsule (25 mg total) by mouth 2 (two) times daily in the am and at bedtime. for 1 day, THEN 1 capsule (25 mg total) daily for 3 days. 3 capsule 0   hydrALAZINE  (APRESOLINE ) 25 MG tablet Take 1 tablet (25 mg total) by mouth every 8 (eight) hours as needed. 30 tablet 0   hydrOXYzine  (ATARAX ) 25 MG tablet Take 1 tablet (25 mg total) by mouth every 6 (six) hours as needed for anxiety (or CIWA score </= 10). 30 tablet 0    PTA Medications:  Facility Ordered Medications  Medication   acetaminophen  (TYLENOL ) tablet 650 mg   alum & mag hydroxide-simeth (MAALOX/MYLANTA) 200-200-20 MG/5ML suspension 30 mL   magnesium  hydroxide (MILK OF MAGNESIA) suspension 30 mL   [COMPLETED] thiamine  (VITAMIN B1) injection 100 mg   multivitamin with minerals tablet 1 tablet   chlordiazePOXIDE  (LIBRIUM ) capsule 25 mg   hydrOXYzine  (ATARAX ) tablet 25 mg   loperamide  (IMODIUM ) capsule 2-4 mg   haloperidol   (HALDOL ) tablet 5 mg   And   diphenhydrAMINE  (BENADRYL ) capsule 50 mg   haloperidol  lactate (HALDOL ) injection 5 mg   And   diphenhydrAMINE  (BENADRYL ) injection 50 mg   And   LORazepam  (ATIVAN ) injection 2 mg   haloperidol  lactate (HALDOL ) injection 10 mg   And   diphenhydrAMINE  (BENADRYL ) injection 50 mg   And   LORazepam  (ATIVAN ) injection 2 mg   [COMPLETED] thiamine  (VITAMIN B1) injection 100 mg   chlordiazePOXIDE  (LIBRIUM ) capsule 25 mg   ondansetron  (ZOFRAN -ODT) disintegrating tablet 4 mg   [COMPLETED] chlordiazePOXIDE  (LIBRIUM ) capsule 25 mg   Followed by   [COMPLETED] chlordiazePOXIDE  (LIBRIUM ) capsule 25 mg   Followed by   chlordiazePOXIDE  (LIBRIUM ) capsule 25 mg   Followed by   NOREEN ON 07/29/2024] chlordiazePOXIDE  (LIBRIUM ) capsule 25 mg   amLODipine  (NORVASC ) tablet 5 mg   hydrALAZINE  (APRESOLINE ) tablet 25 mg   PTA Medications  Medication Sig   [START ON 07/29/2024] amLODipine  (NORVASC ) 5 MG tablet Take 1 tablet (5 mg total) by mouth daily.   hydrALAZINE  (APRESOLINE ) 25 MG tablet Take 1 tablet (25 mg total) by mouth every 8 (eight) hours as needed.   chlordiazePOXIDE  (LIBRIUM ) 25 MG capsule Take 1 capsule (25 mg total) by mouth every 6 (six) hours as needed for withdrawal (CIWA score > 10).   chlordiazePOXIDE  (LIBRIUM ) 25 MG capsule Take 1 capsule (25 mg total) by mouth every 6 (six) hours as needed for withdrawal (CIWA score > 10).   chlordiazePOXIDE  (LIBRIUM ) 25 MG capsule Take 1 capsule (25 mg total) by mouth 2 (two) times daily in the am and at bedtime. for 1 day, THEN 1 capsule (25 mg total) daily for 3 days.   hydrOXYzine  (ATARAX ) 25 MG tablet Take 1 tablet (25 mg total) by mouth every 6 (six) hours as needed for anxiety (or CIWA score </= 10).       07/28/2024   11:36 AM 07/25/2024   11:20 PM 10/20/2023    2:24 PM  Depression screen PHQ 2/9  Decreased Interest 0 1 1  Down, Depressed, Hopeless 0 2 1  PHQ - 2 Score 0 3 2  Altered sleeping  2 0  Tired, decreased  energy  3 1  Change in appetite  2 1  Feeling bad or failure about yourself   3 1  Trouble concentrating  2 0  Moving slowly or fidgety/restless  1 1  Suicidal thoughts  1 0  PHQ-9 Score  17 6   Difficult doing work/chores Not difficult at all  Somewhat difficult     Data saved with a previous flowsheet row definition    Flowsheet Row ED from 07/25/2024 in [facility] Most recent reading at 07/26/2024 12:39 AM ED from 07/25/2024 in [facility] Most recent reading at 07/25/2024 10:23 PM UC from 05/05/2024 in [facility] at [facility]) Most recent reading at 05/05/2024  2:24 PM  C-SSRS RISK CATEGORY Low Risk Low Risk No Risk    Musculoskeletal  Strength & Muscle Tone: within normal limits Gait & Station: normal Patient leans: N/A  Psychiatric Specialty Exam  Presentation  General Appearance:  Appropriate for Environment  Eye Contact: Good  Speech: Clear and Coherent  Speech Volume: Normal  Handedness: Right   Mood and Affect  Mood: Dysphoric  Affect: Appropriate   Thought Process  Thought Processes: Coherent; Linear  Descriptions of Associations:Intact  Orientation:Full (Time, Place and Person)  Thought Content:WDL  Diagnosis of Schizophrenia or Schizoaffective disorder in past: No    Hallucinations:Hallucinations: None  Ideas of Reference:None  Suicidal Thoughts:Suicidal Thoughts: No  Homicidal Thoughts:Homicidal Thoughts: No   Sensorium  Memory: Immediate Good; Recent Good  Judgment: Fair  Insight: Fair   Art Therapist  Concentration: Good  Attention Span: Good  Recall: Good  Fund of Knowledge: Good  Language: Good   Psychomotor Activity  Psychomotor Activity: Psychomotor Activity: Normal   Assets  Assets: Desire for Improvement; Housing; Physical Health; Social Support; Manufacturing Systems Engineer; Financial Resources/Insurance;  Intimacy; Resilience; Vocational/Educational   Sleep  Sleep: Sleep: Fair  Estimated Sleeping Duration (Last 24 Hours): 7.75-9.75 hours  No data recorded  Physical Exam  Physical Exam Constitutional:      Appearance: Normal appearance. He is normal weight.  Cardiovascular:     Rate and Rhythm: Normal rate and regular rhythm.     Pulses: Normal pulses.  Musculoskeletal:        General: Normal range of motion.  Skin:    General: Skin is warm.  Neurological:     General: No focal deficit present.     Mental Status: He is alert and oriented to person, place, and time. Mental status is at baseline.  Psychiatric:        Mood and Affect: Mood normal.        Behavior: Behavior normal.        Thought Content: Thought content normal.        Judgment: Judgment normal.    Review of Systems  Psychiatric/Behavioral:  Positive for depression and substance abuse (etoh). The patient is nervous/anxious.   All other systems reviewed and are negative.  Blood pressure (!) 144/104, pulse 81, temperature 98.3 F (36.8 C), temperature source Oral, resp. rate 18, SpO2 98%. There is no height or weight on file to calculate BMI.  Demographic Factors:  Male, Caucasian, and Low socioeconomic status  Loss Factors: Decrease in vocational status  Historical Factors: NA  Risk Reduction Factors:   Sense of responsibility to family, Living with another person, especially a relative, Positive social support, Positive therapeutic relationship, and Positive coping skills or problem solving skills  Continued Clinical Symptoms:  Depression:   Comorbid alcohol abuse/dependence Insomnia Alcohol/Substance Abuse/Dependencies More than one psychiatric diagnosis Previous Psychiatric Diagnoses and Treatments  Cognitive Features That Contribute To Risk:  None    Suicide Risk:  Minimal: No identifiable suicidal ideation.  Patients presenting with no risk factors but with morbid ruminations;  may be  classified as minimal risk based on the severity of the depressive symptoms  Plan Of Care/Follow-up recommendations:  Activity:  Return to exercise as tolerated Diet:  Heart Healthy Tests:  Follow up with out patient primary care Other:  Continue taking medications and follow up with outpatient primary care.   Disposition: Discharge homw with outpatient services see AVS.   Majel GORMAN Ramp, FNP 07/28/2024, 11:36 AM

## 2024-07-28 NOTE — Care Management (Signed)
 FBC Care Management..  Patient requested out patient services  Patient will attend AA/NA meetings, find a sponsor, meeting information provided  Patient will connect with therapist, suggestions listed in AVS   Patient will discharge to home 07/28/2024 by 11:00 am  [address]  [place] [address]   Patient will arrange transportation  30 day scripts

## 2024-07-28 NOTE — ED Notes (Signed)
 Bp check167/118 auto, 164/122 mmhg manual. Provider notified.

## 2024-07-28 NOTE — Group Note (Signed)
 Group Topic: Decisional Balance/Substance Abuse  Group Date: 07/28/2024 Start Time: 1100 End Time: 1200 Facilitators: Gearline Spilman, LCSW  Department: [facility]  Number of Participants: 5  Group Focus: acceptance, art therapy, chemical dependency education, chemical dependency issues, community group, and coping skills Treatment Modality:  Art Therapy and Cognitive Behavioral Therapy Interventions utilized were group exercise, patient education, and support Purpose: enhance coping skills, express feelings, express irrational fears, improve communication skills, regain self-worth, reinforce self-care, relapse prevention strategies, and trigger / craving management  Writer met with the group to explore how they view themselves, how the world views them, and how substance abuse factors into self-worth/perception.  Writer utilized Art Therapy and CBT to explore these topics.  Writer utilized a worksheet were the client had to depict themselves vs how the world views them.  Writer explored triggers and coping skills with the group.  Name: Billy Johnson Date of Birth: July 13, 1991  MR: [id number]     Level of Participation: active Quality of Participation: cooperative and engaged Interactions with others: gave feedback Mood/Affect: anxious and brightens with interaction Triggers (if applicable): Client reports his view on his own self worth and being depressed.  Cognition: goal directed and logical Progress: Significant Response: Client was open, supportive, and optimistic. Plan: follow-up as needed  Patients Problems:  Patient Active Problem List   Diagnosis Date Noted   Alcohol use disorder, severe, dependence (HCC) 07/25/2024   Alcohol abuse 07/25/2024   Attention deficit 04/22/2023   Establishing care with new doctor, encounter for 06/01/2022   Depression 06/01/2022   Avascular necrosis of hip, right (HCC) 07/18/2021   Alcohol use disorder, severe,  in early remission (HCC) 07/18/2021   Obesity (BMI 30.0-34.9) 07/18/2021   Vapes nicotine containing substance 07/18/2021   Essential hypertension 07/18/2021

## 2024-07-28 NOTE — Group Note (Signed)
 Group Topic: Relapse and Recovery  Group Date: 07/28/2024 Start Time: 1300 End Time: 1330 Facilitators: Judi Monico RAMAN, NT  Department: [facility]  Number of Participants: 1  Group Focus: check in Treatment Modality:  Psychoeducation Interventions utilized were patient education Purpose: enhance coping skills, express feelings, and increase insight  Name: Teejay Meader Date of Birth: 03-09-1991  MR: [id number]    Level of Participation: Pt did not attend group  Patients Problems:  Patient Active Problem List   Diagnosis Date Noted   Alcohol use disorder, severe, dependence (HCC) 07/25/2024   Alcohol abuse 07/25/2024   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

## 2024-07-28 NOTE — ED Notes (Signed)
 Patient is sleeping in bedroom. NAD

## 2024-07-28 NOTE — ED Notes (Signed)
 Pt reports that prn atarax  was effective in decreasing anxiety level. Pt administered prn hydralazine  for elevated BP.

## 2024-08-01 ENCOUNTER — Ambulatory Visit (INDEPENDENT_AMBULATORY_CARE_PROVIDER_SITE_OTHER): Admitting: Licensed Clinical Social Worker

## 2024-08-01 ENCOUNTER — Encounter (HOSPITAL_COMMUNITY): Payer: Self-pay

## 2024-08-01 DIAGNOSIS — F1020 Alcohol dependence, uncomplicated: Secondary | ICD-10-CM | POA: Diagnosis not present

## 2024-08-01 DIAGNOSIS — F39 Unspecified mood [affective] disorder: Secondary | ICD-10-CM

## 2024-08-01 NOTE — Progress Notes (Signed)
 THERAPIST PROGRESS NOTE  Session Time: 10:10 a.m. to 11:08 a.m.   Type of Therapy: Individual   Therapist Response/Interventions: Solution Focused/the therapist briefly educates Macauley and his partner about addiction as an hereditary disease which can be exacerbated by stress similar to all other chronic illnesses.  The therapist talks about Alcoholics Anonymous as a possible avenue in helping [name] to build sober supports.  Additionally, he discusses the stages of relapse noting that self-care and self compassion are critical in preventing a person from entering into the first stage of relapse which is emotional relapse.  The therapists discloses his belief that Leveon could benefit from some cognitive behavioral therapy to learn how to counter his negative, self-critical self-talk.  Treatment Goals addressed:  Active     Substance Use     Araceli will experience a decrease in his symptoms of depression and anxiety as evidenced by his PHQ-9 and GAD-7 scores both being a 4 or less. (Initial)     Start:  08/01/24    Expected End:  08/01/25         Kabeer will abstain from drinking alcohol while simultaneously building at least 3 sober supports per self-report. (Initial)     Start:  08/01/24    Expected End:  08/01/25         The therapist will assist Sehaj in being able to identify and change thoughts and behaviors that contribute to his feelings of depression and anxiety.     Start:  08/01/24         The therapist will assist Kden in being able to identify and avoid triggers for drinking alcohol while assisting him in understanding more about the disease of addiction.     Start:  08/01/24            Summary: Kyron presents for his initial appointment with this therapist accompanied by his partner, Ezzie.  He recently discharged from detox at the [facility]  Previously, he attended the [facility] years ago.  As a child, he says that his mother had him tested and told him that  he had a slight case of ADHD.  Uday states his belief that his substance use stems from he has problems with anxiety.  He says that he can go for 4 weeks and then have a binge or possibly 8 months and then have a binge.  He says that his longest sustained sobriety was for about 9 months.  He will get stressed out about something and then decide to drink as he has the feeling that he needs to relax.  Humbert says that his father had issues with alcoholism and that he has a cousin who is way worse than he is.  He has been together with his partner, Ezzie, for the past 7 years.  Jody says that Braidyn is really hyper and has a lot of energy.  He says that Deadrian is about to finish college for the second time graduating at the top of his class.  Jody states his belief that Wilferd's lack of self-esteem and negative self-talk are the result of his father having left when Jayron was 23 years old adding that they were very close.  Venancio's middle sister has anger issues and his younger sister is on some type of medication for anxiety.  In the past, Jeovanny was on Wellbutrin but his partner says that Kadence did not take it long enough to see an effect.  At one point,  he was on Vivitrol  for a couple of months but was unable to continue it due to the cost.  Ezzie says that Erle was afraid to drink while he was on this medication.  He says that Lamoyne spends a lot of time gaming and questions if this may be a trigger for him to drink.  It is noted that Ransom does not currently have any friends and that he has a lot of social anxiety as it relates to meeting new people.  He used to have friends with whom he did some type of core training but got away from this when he experienced hip problems which eventually led to his having to get a hip replacement.  Recently, his mother got him a gym membership in the hopes that it would help him to not drink and show on was going to the gym approximately 5 days/week.  Today, his PHQ-9 is a 5 and his  GAD 7 is a 15.  By the conclusion of the session, Isham indicates his desire to continue with this therapist but would prefer to have a medication prescriber closer to where he and his partner live in [facility] but does schedule an initial psychiatric evaluation at this office in the event that he has difficulty locating a provider closer to home.   Progress Towards Goals: Initial  Suicidal/Homicidal: No SI or HI  Plan: Return again in 2 weeks.  Diagnosis: Alcohol use disorder, severe and unspecified at affective disorder  Collaboration of Care: Other Eryck's partner attends the session today with his permission.  Patient/Guardian was advised Release of Information must be obtained prior to any record release in order to collaborate their care with an outside provider. Patient/Guardian was advised if they have not already done so to contact the registration department to sign all necessary forms in order for us  to release information regarding their care.   Consent: Patient/Guardian gives verbal consent for treatment and assignment of benefits for services provided during this visit. Patient/Guardian expressed understanding and agreed to proceed.   Zell Maier, MA, LCSW, [facility], LCAS 08/02/2023

## 2024-08-15 ENCOUNTER — Ambulatory Visit (INDEPENDENT_AMBULATORY_CARE_PROVIDER_SITE_OTHER): Admitting: Licensed Clinical Social Worker

## 2024-08-15 DIAGNOSIS — F1020 Alcohol dependence, uncomplicated: Secondary | ICD-10-CM | POA: Diagnosis not present

## 2024-08-15 DIAGNOSIS — F39 Unspecified mood [affective] disorder: Secondary | ICD-10-CM

## 2024-08-15 NOTE — Progress Notes (Signed)
 THERAPIST PROGRESS NOTE  Session Time: 10:10 a.m. to  11:10 a.m.   Type of Therapy: Individual   Therapist Response/Interventions: CBT/the therapist provides Billy Johnson with a list of cognitive distortions view and models how to use rational self statements to dispute these cognitive distortions.  The therapist explains that doing so is part of practicing self-care which is essential to avoid entering into a emotional relapse which can eventually lead to actual relapse.  The therapist strongly reinforces Billy Johnson's meeting attendance and regular exercise disclosing his belief that he is currently on the right path.  Additionally, the therapist validates that Billy Johnson does belong at these alcoholic's Anonymous meetings and that these older members' stories are how his life would end up if he would continue to drink as the disease of addiction is potentially progressive.  Treatment Goals addressed:  Active     Substance Use     Billy Johnson will experience a decrease in his symptoms of depression and anxiety as evidenced by his PHQ-9 and GAD-7 scores both being a 4 or less. (Progressing)     Start:  08/01/24    Expected End:  08/01/25         Billy Johnson will abstain from drinking alcohol while simultaneously building at least 3 sober supports per self-report. (Progressing)     Start:  08/01/24    Expected End:  08/01/25         The therapist will assist Billy Johnson in being able to identify and change thoughts and behaviors that contribute to his feelings of depression and anxiety.     Start:  08/01/24         The therapist will assist Billy Johnson in being able to identify and avoid triggers for drinking alcohol while assisting him in understanding more about the disease of addiction.     Start:  08/01/24               Summary: Billy Johnson presents saying that the past 2 weeks have gone well and that he has not drunk any alcohol and has been going to the gym.  Additionally, he has attended some AA meetings near where he lives  and even shared at one of the meetings.  He says that the people there were friendly and that the meetings were not too big which is his preference.  He got some numbers from one of the people in the home group.  He states his intent to try out a few other meetings to see what they are like.  He expresses his fear that he might not belong given that others in the meeting had alcohol use disorders that had progressed to a more severe point than his own.  He is able to recognize that all of these people are older than he is noting that there is only 1 person in the meeting who is his junior.  Billy Johnson says that he has installed a device on his car such that it will not start without his blowing into a device that connects to his cell phone.  He likes this device as it is only $30 a month and no one is able to tell that he has a blown and go like the ones typically installed by professional companies.  He says that his relationship with his partner is still somewhat strained from his drinking.  He says that they have been together for 7 years and rates his satisfaction as 5050.  He says that the part of him that likes the relationship  realizes that his partner cares deeply for him and loves him; however, the other part of him is not happy with the fact that his partner seems to talk on endlessly while not pausing for Billy Johnson to be able to speak.  Also, when he does speak, he does not feel like his partner is really listening.  Eventually concludes that he probably needs to assert himself more about this.  Towards the end of the session, the therapist and Billy Johnson talk about cognitive distortions and the therapist introduces him to the use of rational self-talk to combat these thoughts.   Progress Towards Goals: Progressing   Suicidal/Homicidal: No SI or HI  Plan: Return again in 1 week.  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  Patient/Guardian was advised Release of Information must be obtained prior to any record release in order to collaborate their care with an outside provider. Patient/Guardian was advised if they have not already done so to contact the registration department to sign all necessary forms in order for us  to release information regarding their care.   Consent: Patient/Guardian gives verbal consent for treatment and assignment of benefits for services provided during this visit. Patient/Guardian expressed understanding and agreed to proceed.   Zell Maier, MA, LCSW, [facility], LCAS 08/15/2024

## 2024-08-29 ENCOUNTER — Ambulatory Visit (HOSPITAL_COMMUNITY): Admitting: Licensed Clinical Social Worker

## 2024-10-18 ENCOUNTER — Ambulatory Visit (HOSPITAL_COMMUNITY): Admitting: Student in an Organized Health Care Education/Training Program
